# Patient Record
Sex: Female | Born: 1945 | Race: Black or African American | Hispanic: No | Marital: Married | Smoking: Never smoker
Health system: Southern US, Community
[De-identification: ages and names within clinical notes are randomized; demographics above are authoritative.]

## PROBLEM LIST (undated history)

## (undated) DIAGNOSIS — E039 Hypothyroidism, unspecified: Secondary | ICD-10-CM

## (undated) DIAGNOSIS — R519 Headache, unspecified: Secondary | ICD-10-CM

## (undated) DIAGNOSIS — I10 Essential (primary) hypertension: Secondary | ICD-10-CM

## (undated) DIAGNOSIS — E079 Disorder of thyroid, unspecified: Secondary | ICD-10-CM

## (undated) DIAGNOSIS — M1990 Unspecified osteoarthritis, unspecified site: Secondary | ICD-10-CM

## (undated) HISTORY — PX: CATARACT EXTRACTION: SUR2

## (undated) HISTORY — PX: TUBAL LIGATION: SHX77

## (undated) HISTORY — PX: KNEE ARTHROPLASTY: SHX992

---

## 2016-06-02 DIAGNOSIS — Z87898 Personal history of other specified conditions: Secondary | ICD-10-CM | POA: Diagnosis not present

## 2016-06-02 DIAGNOSIS — R0602 Shortness of breath: Secondary | ICD-10-CM | POA: Diagnosis not present

## 2016-06-02 DIAGNOSIS — K219 Gastro-esophageal reflux disease without esophagitis: Secondary | ICD-10-CM | POA: Diagnosis not present

## 2016-06-02 DIAGNOSIS — Z8679 Personal history of other diseases of the circulatory system: Secondary | ICD-10-CM | POA: Diagnosis not present

## 2016-06-02 DIAGNOSIS — E039 Hypothyroidism, unspecified: Secondary | ICD-10-CM | POA: Diagnosis not present

## 2016-06-03 DIAGNOSIS — Z23 Encounter for immunization: Secondary | ICD-10-CM | POA: Diagnosis not present

## 2016-06-03 DIAGNOSIS — E039 Hypothyroidism, unspecified: Secondary | ICD-10-CM | POA: Diagnosis not present

## 2016-06-09 DIAGNOSIS — R0602 Shortness of breath: Secondary | ICD-10-CM | POA: Diagnosis not present

## 2016-06-09 DIAGNOSIS — K765 Hepatic veno-occlusive disease: Secondary | ICD-10-CM | POA: Diagnosis not present

## 2016-06-09 DIAGNOSIS — E876 Hypokalemia: Secondary | ICD-10-CM | POA: Diagnosis not present

## 2016-06-09 DIAGNOSIS — J45909 Unspecified asthma, uncomplicated: Secondary | ICD-10-CM | POA: Diagnosis not present

## 2016-06-09 DIAGNOSIS — Z8679 Personal history of other diseases of the circulatory system: Secondary | ICD-10-CM | POA: Diagnosis not present

## 2017-01-15 ENCOUNTER — Ambulatory Visit (HOSPITAL_COMMUNITY)
Admission: EM | Admit: 2017-01-15 | Discharge: 2017-01-15 | Disposition: A | Discharge status: Home / Self Care | Payer: Medicare Other | Attending: Nurse Practitioner | Admitting: Nurse Practitioner

## 2017-01-15 ENCOUNTER — Encounter (HOSPITAL_COMMUNITY): Payer: Self-pay | Admitting: Family Medicine

## 2017-01-15 DIAGNOSIS — L247 Irritant contact dermatitis due to plants, except food: Secondary | ICD-10-CM | POA: Diagnosis not present

## 2017-01-15 DIAGNOSIS — B372 Candidiasis of skin and nail: Secondary | ICD-10-CM | POA: Diagnosis not present

## 2017-01-15 HISTORY — DX: Disorder of thyroid, unspecified: E07.9

## 2017-01-15 HISTORY — DX: Essential (primary) hypertension: I10

## 2017-01-15 MED ORDER — CLOTRIMAZOLE 1 % EX CREA: TOPICAL_CREAM | CUTANEOUS | 0 refills | Status: DC

## 2017-01-15 MED ORDER — PREDNISONE 20 MG PO TABS: 20.0000 mg | ORAL_TABLET | Freq: Every day | ORAL | 0 refills | Status: AC

## 2017-01-15 MED ORDER — PREDNISONE 20 MG PO TABS: 20.0000 mg | ORAL_TABLET | Freq: Every day | ORAL | 0 refills | Status: DC

## 2017-01-15 NOTE — ED Triage Notes (Signed)
Pt here for rash to right FA and spreading to chest and face. sts she has been in her sons back yard. Possibly poison ivy. Area blistered and oozing.

## 2017-01-15 NOTE — ED Provider Notes (Signed)
CSN: [id number]     Arrival date & time 01/15/17  1535 History   First MD Initiated Contact with Patient 01/15/17 1625     Chief Complaint  Patient presents with  . Rash   (Consider location/radiation/quality/duration/timing/severity/associated sxs/prior Treatment) Patient is a well-appearing 71 y.o. Female, here for rash. She reports rash on her fight upper forearm for almost one week that is describe as very itchy and is spreading and draining. She was at her son's backyard prior to rash onset and believes that she may got in contact with poison ivy. While she is here, she also want her rash underneath her right breast to be evaluated as well. Patient endorses the rash under right breast for 1 week as well, is a recurrent problem, she believes it is from moisture. In the past, she would put corn starch on the rash and rash would resolved.        Past Medical History:  Diagnosis Date  . Hypertension   . Thyroid disease    History reviewed. No pertinent surgical history. History reviewed. No pertinent family history. Social History  Substance Use Topics  . Smoking status: Never Smoker  . Smokeless tobacco: Never Used  . Alcohol use Not on file   OB History    No data available     Review of Systems  Constitutional: Negative for fatigue and fever.  Respiratory: Negative.   Cardiovascular: Negative.   Skin: Positive for rash.    Allergies  Patient has no known allergies.  Home Medications   Prior to Admission medications   Medication Sig Start Date End Date Taking? Authorizing Provider  clotrimazole (LOTRIMIN) 1 % cream Apply to affected area 2 times daily for 2-4 weeks. 01/15/17   Lucia Estelle, NP  predniSONE (DELTASONE) 20 MG tablet Take 1 tablet (20 mg total) by mouth daily with breakfast. Take 60 mg on day 1-3, take 40 mg on day 4-6, take 20 mg on 7-9 01/15/17 01/24/17  Lucia Estelle, NP   Meds Ordered and Administered this Visit  Medications - No data to display  BP (!)  141/73   Pulse 83   Temp 97.5 F (36.4 C)   Resp 18   SpO2 96%  No data found.   Physical Exam  Constitutional: She is oriented to person, place, and time. She appears well-developed and well-nourished.  Cardiovascular: Normal rate.   Pulmonary/Chest: Effort normal.  Neurological: She is alert and oriented to person, place, and time.  Skin:  See pictures below.   Forearm: Has bulla and vesicles, has serous drainage.  Breast: Rash under right breast is moist with diffuse erythema with no papule or vesicle. Is non-tender to palpate. Wood's lamp examination unremarkable.    Nursing note and vitals reviewed.       Urgent Care Course     Procedures (including critical care time)  Labs Review Labs Reviewed - No data to display  Imaging Review No results found.  MDM   1. Irritant contact dermatitis due to plants, except food   2. Candidiasis, intertrigo    1) Start prednisone 9-day taper, take benadryl for itchiness. Keep wound cover. Change dressing daily 2) Apply clotrimazole cream BID x 2-4 weeks.  3) Please follow up with PCP for no improvement.     Lucia Estelle, NP 01/15/17 1711

## 2017-01-15 NOTE — Discharge Instructions (Signed)
Your prescriptions has been send to your pharmacy. Please follow up with your primary care doctor if you notice no improvement.

## 2017-01-29 ENCOUNTER — Emergency Department (HOSPITAL_COMMUNITY)
Admission: EM | Admit: 2017-01-29 | Discharge: 2017-01-29 | Disposition: A | Discharge status: Home / Self Care | Payer: Medicare Other | Attending: Emergency Medicine | Admitting: Emergency Medicine

## 2017-01-29 ENCOUNTER — Emergency Department (HOSPITAL_COMMUNITY): Payer: Medicare Other

## 2017-01-29 ENCOUNTER — Encounter (HOSPITAL_COMMUNITY): Payer: Self-pay | Admitting: *Deleted

## 2017-01-29 DIAGNOSIS — Z79899 Other long term (current) drug therapy: Secondary | ICD-10-CM | POA: Diagnosis not present

## 2017-01-29 DIAGNOSIS — I10 Essential (primary) hypertension: Secondary | ICD-10-CM | POA: Diagnosis not present

## 2017-01-29 DIAGNOSIS — R1011 Right upper quadrant pain: Secondary | ICD-10-CM | POA: Diagnosis not present

## 2017-01-29 DIAGNOSIS — R1013 Epigastric pain: Secondary | ICD-10-CM | POA: Diagnosis not present

## 2017-01-29 DIAGNOSIS — R109 Unspecified abdominal pain: Secondary | ICD-10-CM

## 2017-01-29 LAB — HEPATIC FUNCTION PANEL
ALBUMIN: 3.7 g/dL (ref 3.5–5.0)
ALT: 26 U/L (ref 14–54)
AST: 18 U/L (ref 15–41)
Alkaline Phosphatase: 72 U/L (ref 38–126)
BILIRUBIN TOTAL: 0.7 mg/dL (ref 0.3–1.2)
Bilirubin, Direct: 0.1 mg/dL (ref 0.1–0.5)
Indirect Bilirubin: 0.6 mg/dL (ref 0.3–0.9)
TOTAL PROTEIN: 7 g/dL (ref 6.5–8.1)

## 2017-01-29 LAB — CBC
HEMATOCRIT: 39.9 % (ref 36.0–46.0)
Hemoglobin: 12.5 g/dL (ref 12.0–15.0)
MCH: 29.3 pg (ref 26.0–34.0)
MCHC: 31.3 g/dL (ref 30.0–36.0)
MCV: 93.7 fL (ref 78.0–100.0)
PLATELETS: 294 10*3/uL (ref 150–400)
RBC: 4.26 MIL/uL (ref 3.87–5.11)
RDW: 13.9 % (ref 11.5–15.5)
WBC: 6 10*3/uL (ref 4.0–10.5)

## 2017-01-29 LAB — BASIC METABOLIC PANEL
Anion gap: 6 (ref 5–15)
BUN: 12 mg/dL (ref 6–20)
CHLORIDE: 103 mmol/L (ref 101–111)
CO2: 30 mmol/L (ref 22–32)
Calcium: 9.3 mg/dL (ref 8.9–10.3)
Creatinine, Ser: 1.13 mg/dL — ABNORMAL HIGH (ref 0.44–1.00)
GFR calc Af Amer: 55 mL/min — ABNORMAL LOW (ref >60)
GFR calc non Af Amer: 48 mL/min — ABNORMAL LOW (ref >60)
Glucose, Bld: 127 mg/dL — ABNORMAL HIGH (ref 65–99)
POTASSIUM: 3.2 mmol/L — AB (ref 3.5–5.1)
Sodium: 139 mmol/L (ref 135–145)

## 2017-01-29 LAB — I-STAT TROPONIN, ED
TROPONIN I, POC: 0 ng/mL (ref 0.00–0.08)
Troponin i, poc: 0 ng/mL (ref 0.00–0.08)

## 2017-01-29 LAB — LIPASE, BLOOD: Lipase: 23 U/L (ref 11–51)

## 2017-01-29 MED ORDER — HYDROCODONE-ACETAMINOPHEN 5-325 MG PO TABS: 1.0000 | ORAL_TABLET | Freq: Four times a day (QID) | ORAL | 0 refills | Status: DC | PRN

## 2017-01-29 MED ORDER — GI COCKTAIL ~~LOC~~
30.0000 mL | Freq: Once | ORAL | Status: AC
  Administered 2017-01-29: 30 mL via ORAL
  Filled 2017-01-29: qty 30

## 2017-01-29 MED ORDER — OMEPRAZOLE 20 MG PO CPDR: 20.0000 mg | DELAYED_RELEASE_CAPSULE | Freq: Two times a day (BID) | ORAL | 0 refills | Status: AC

## 2017-01-29 NOTE — ED Notes (Signed)
Patient in room and on monitor after ultrasound.

## 2017-01-29 NOTE — Discharge Instructions (Signed)
Prilosec as prescribed.  Hydrocodone as prescribed as needed for pain.  Return to the emergency department if your symptoms significantly worsen or change.

## 2017-01-29 NOTE — ED Notes (Signed)
Pt returns from xray

## 2017-01-29 NOTE — ED Triage Notes (Signed)
PT reports Epigastric pressure since Thursday . Pt also reports burping  . Pt burping during triage . Pt denies any N/V.

## 2017-01-29 NOTE — ED Provider Notes (Signed)
[facility]ENCY [facility]PT Provider Note   CSN: [id number] Arrival date & time: 01/29/17  4098     History   Chief Complaint Chief Complaint  Patient presents with  . Chest Pain    HPI Joann Nguyen is a 71 y.o. female.  Patient is a 71 year old female with past medical history of hypertension, hypothyroidism. She presents today for evaluation of epigastric, lower chest discomfort that has been ongoing for the past 3 days. She describes this as a cramping and if "something is moving around in there". She denies any fevers or chills. She denies any shortness of breath or diaphoresis, but does feel somewhat nauseated and is if she has to belch. She denies any exertional component. She denies any relation to food.  Patient recently relocated here from [place] and has no primary doctor locally and no prior medical records are available for review.   The history is provided by the patient.  Chest Pain   This is a new problem. Episode onset: 3 days ago. The problem has been gradually worsening. The pain is present in the epigastric region. The pain is moderate. Quality: Cramping. The pain does not radiate. Associated symptoms include abdominal pain and nausea. Pertinent negatives include no diaphoresis, no fever and no shortness of breath. She has tried nothing for the symptoms.    Past Medical History:  Diagnosis Date  . Hypertension   . Thyroid disease     There are no active problems to display for this patient.   History reviewed. No pertinent surgical history.  OB History    No data available       Home Medications    Prior to Admission medications   Medication Sig Start Date End Date Taking? Authorizing Provider  hydrochlorothiazide (HYDRODIURIL) 25 MG tablet Take 25 mg by mouth daily.   Yes [provider]  levothyroxine (SYNTHROID, LEVOTHROID) 75 MCG tablet Take 75 mcg by mouth daily before breakfast.   Yes [provider]  lovastatin (MEVACOR) 20 MG  tablet Take 20 mg by mouth at bedtime.   Yes [provider]  ranitidine (ZANTAC) 150 MG tablet Take 150 mg by mouth daily.   Yes [provider]  clotrimazole (LOTRIMIN) 1 % cream Apply to affected area 2 times daily for 2-4 weeks. Patient not taking: Reported on 01/29/2017 01/15/17   Lucia Estelle, NP    Family History History reviewed. No pertinent family history.  Social History Social History  Substance Use Topics  . Smoking status: Never Smoker  . Smokeless tobacco: Never Used  . Alcohol use 0.6 oz/week    1 Glasses of wine per week     Comment: social     Allergies   Patient has no known allergies.   Review of Systems Review of Systems  Constitutional: Negative for diaphoresis and fever.  Respiratory: Negative for shortness of breath.   Cardiovascular: Positive for chest pain.  Gastrointestinal: Positive for abdominal pain and nausea.  All other systems reviewed and are negative.    Physical Exam Updated Vital Signs BP 135/70 (BP Location: Left Arm)   Pulse 81   Temp 98.3 F (36.8 C) (Oral)   Resp (!) 8   Ht 5' (1.524 m)   Wt 183 lb 1 oz (83 kg)   SpO2 98%   BMI 35.75 kg/m   Physical Exam  Constitutional: She is oriented to person, place, and time. She appears well-developed and well-nourished. No distress.  HENT:  Head: Normocephalic and atraumatic.  Neck: Normal  range of motion. Neck supple.  Cardiovascular: Normal rate and regular rhythm.  Exam reveals no gallop and no friction rub.   No murmur heard. Pulmonary/Chest: Effort normal and breath sounds normal. No respiratory distress. She has no wheezes.  Abdominal: Soft. Bowel sounds are normal. She exhibits no distension. There is tenderness. There is no rebound and no guarding.  There is tenderness to palpation in the right upper quadrant and epigastric region.  Musculoskeletal: Normal range of motion.  Neurological: She is alert and oriented to person, place, and time.  Skin: Skin is  warm and dry. She is not diaphoretic.  Nursing note and vitals reviewed.    ED Treatments / Results  Labs (all labs ordered are listed, but only abnormal results are displayed) Labs Reviewed  BASIC METABOLIC PANEL  CBC  LIPASE, BLOOD  I-STAT TROPOININ, ED  I-STAT TROPOININ, ED    EKG  EKG Interpretation  Date/Time:  Saturday Jan 29 2017 08:49:43 EDT Ventricular Rate:  83 PR Interval:  140 QRS Duration: 66 QT Interval:  362 QTC Calculation: 425 R Axis:   15 Text Interpretation:  Normal sinus rhythm Low voltage QRS Cannot rule out Anterior infarct , age undetermined Abnormal ECG Confirmed by Onur Mori  MD, Lauralie Blacksher ([id number]) on 01/29/2017 9:08:25 AM       Radiology No results found.  Procedures Procedures (including critical care time)  Medications Ordered in ED Medications - No data to display   Initial Impression / Assessment and Plan / ED Course  I have reviewed the triage vital signs and the nursing notes.  Pertinent labs & imaging results that were available during my care of the patient were reviewed by me and considered in my medical decision making (see chart for details).  Patient presents here with complaints of epigastric pain. She describes a sensation of "something rolling around in her upper abdomen". This is been ongoing for the past 3 days. Her symptoms do not sound cardiac in nature. Her EKG is showing nonspecific T wave abnormalities, however no evidence for injury. Her troponin 2 is negative.  She is tender to palpation in the epigastric region. For this reason, she underwent an ultrasound which was unremarkable as well. She is feeling better after a GI cocktail. Her symptoms may well be related to a gastritis or possible early ulcer. My plan is to discharge with antacids, pain medicine, and follow-up with her primary doctor.  Final Clinical Impressions(s) / ED Diagnoses   Final diagnoses:  Abdominal pain    New Prescriptions New Prescriptions   No  medications on file     Geoffery Lyonselo, Deforrest Bogle, MD 01/29/17 1426

## 2017-01-29 NOTE — ED Notes (Signed)
Patient transported to US 

## 2017-10-18 DIAGNOSIS — E039 Hypothyroidism, unspecified: Secondary | ICD-10-CM | POA: Diagnosis not present

## 2017-10-18 DIAGNOSIS — K219 Gastro-esophageal reflux disease without esophagitis: Secondary | ICD-10-CM | POA: Diagnosis not present

## 2017-10-18 DIAGNOSIS — Z1211 Encounter for screening for malignant neoplasm of colon: Secondary | ICD-10-CM | POA: Diagnosis not present

## 2017-10-18 DIAGNOSIS — R2232 Localized swelling, mass and lump, left upper limb: Secondary | ICD-10-CM | POA: Diagnosis not present

## 2017-10-18 DIAGNOSIS — Z0000 Encounter for general adult medical examination without abnormal findings: Secondary | ICD-10-CM | POA: Diagnosis not present

## 2017-10-18 DIAGNOSIS — I10 Essential (primary) hypertension: Secondary | ICD-10-CM | POA: Diagnosis not present

## 2017-10-18 DIAGNOSIS — E782 Mixed hyperlipidemia: Secondary | ICD-10-CM | POA: Diagnosis not present

## 2017-11-04 DIAGNOSIS — K5730 Diverticulosis of large intestine without perforation or abscess without bleeding: Secondary | ICD-10-CM | POA: Diagnosis not present

## 2017-11-04 DIAGNOSIS — Z1211 Encounter for screening for malignant neoplasm of colon: Secondary | ICD-10-CM | POA: Diagnosis not present

## 2018-01-09 DIAGNOSIS — H25813 Combined forms of age-related cataract, bilateral: Secondary | ICD-10-CM | POA: Diagnosis not present

## 2018-01-16 IMAGING — DX DG CHEST 2V
2 series · 2 of 2 positions shown · non-contrast
Comparison: None.

CLINICAL DATA: Chest pain.

EXAM:
CHEST  2 VIEW

[chest pa]
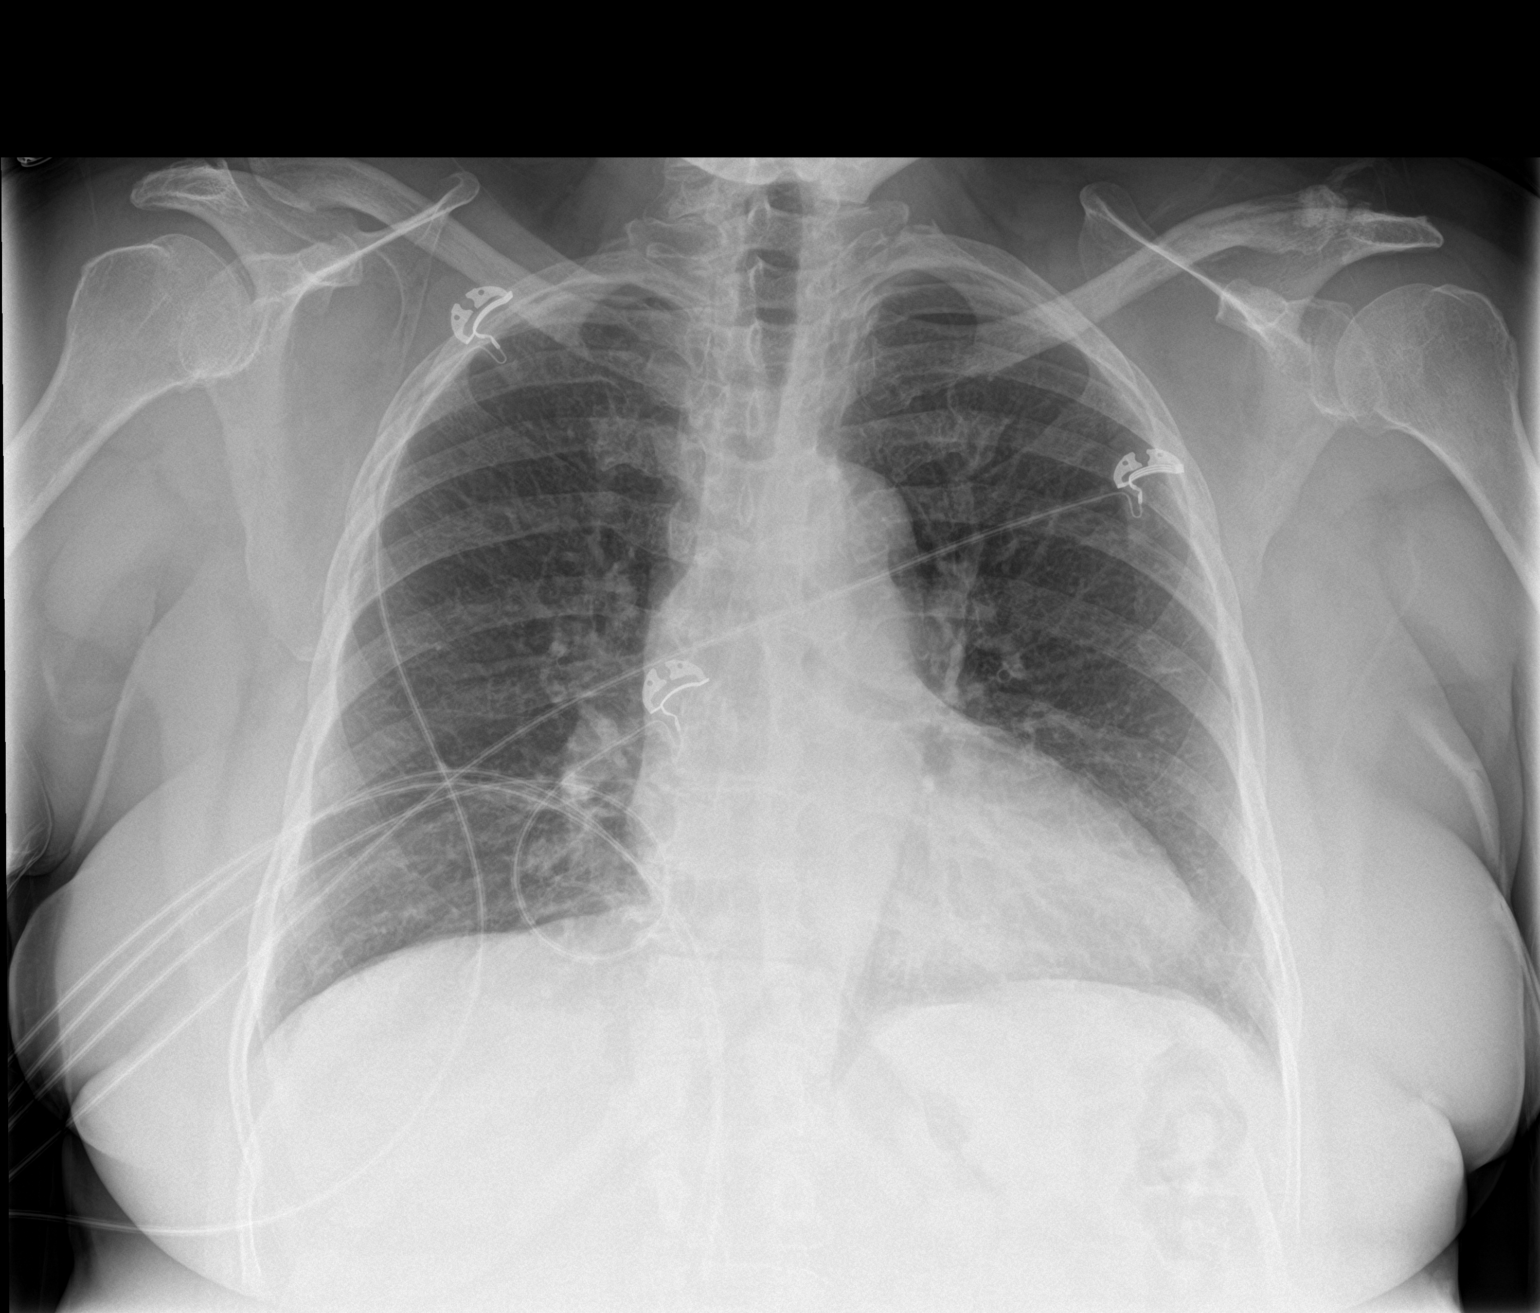

[chest lat]
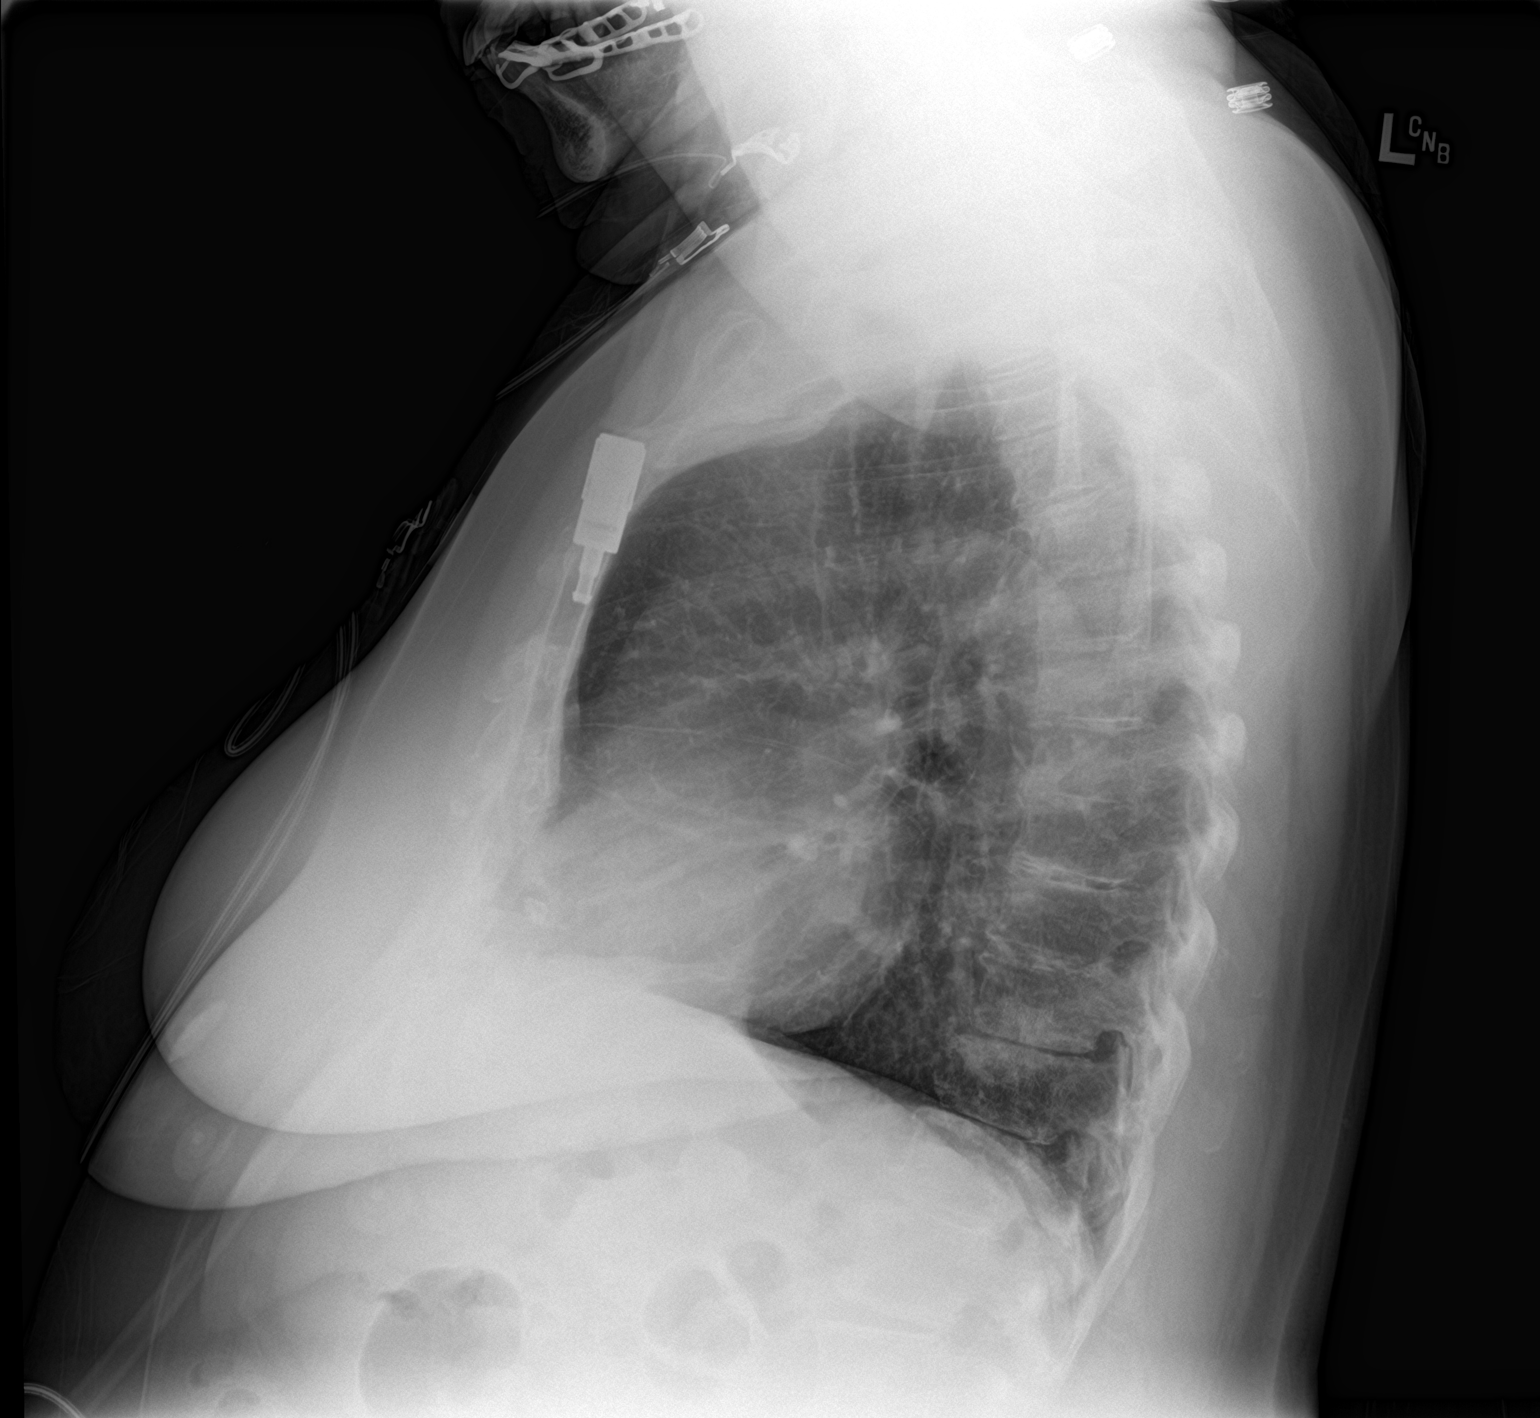

[2 of 2 positions shown; findings below may reference images not displayed]

FINDINGS: The heart size and mediastinal contours are within normal limits.
There is no evidence of pulmonary edema, consolidation,
pneumothorax, nodule or pleural fluid. The thoracic spine
demonstrates diffuse degenerative disc disease
IMPRESSION: No active cardiopulmonary disease.

## 2018-06-20 DIAGNOSIS — Z1159 Encounter for screening for other viral diseases: Secondary | ICD-10-CM | POA: Diagnosis not present

## 2018-06-20 DIAGNOSIS — Z9109 Other allergy status, other than to drugs and biological substances: Secondary | ICD-10-CM | POA: Diagnosis not present

## 2018-06-20 DIAGNOSIS — E039 Hypothyroidism, unspecified: Secondary | ICD-10-CM | POA: Diagnosis not present

## 2018-06-20 DIAGNOSIS — E782 Mixed hyperlipidemia: Secondary | ICD-10-CM | POA: Diagnosis not present

## 2018-06-20 DIAGNOSIS — I10 Essential (primary) hypertension: Secondary | ICD-10-CM | POA: Diagnosis not present

## 2018-06-20 DIAGNOSIS — Z23 Encounter for immunization: Secondary | ICD-10-CM | POA: Diagnosis not present

## 2018-06-20 DIAGNOSIS — Z780 Asymptomatic menopausal state: Secondary | ICD-10-CM | POA: Diagnosis not present

## 2018-06-21 ENCOUNTER — Other Ambulatory Visit: Payer: Self-pay | Admitting: Physician Assistant

## 2018-06-21 DIAGNOSIS — R5381 Other malaise: Secondary | ICD-10-CM

## 2018-07-11 IMAGING — US US ABDOMEN LIMITED
1 series · 14 of 25 positions shown · non-contrast
Comparison: None.

CLINICAL DATA: Abdominal pain.

EXAM:
US ABDOMEN LIMITED - RIGHT UPPER QUADRANT

[Series 1: us abdomen limited · 0.22mm/px · 14 of 32 slices shown]
[im 1/32]
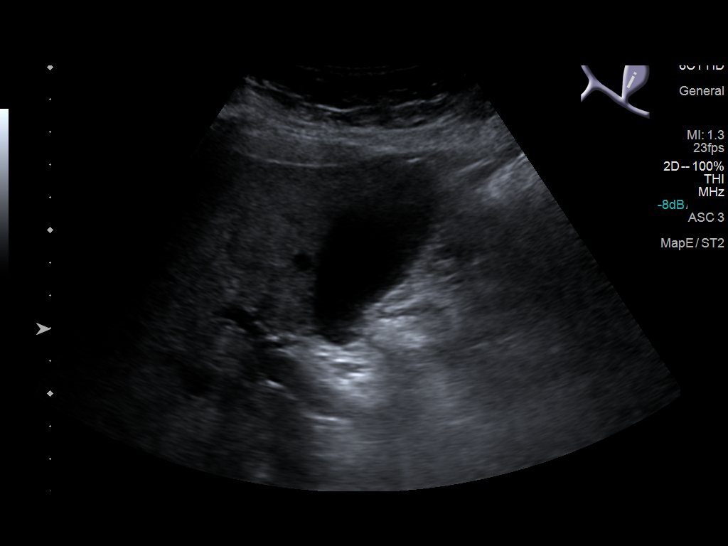
[im 3/32]
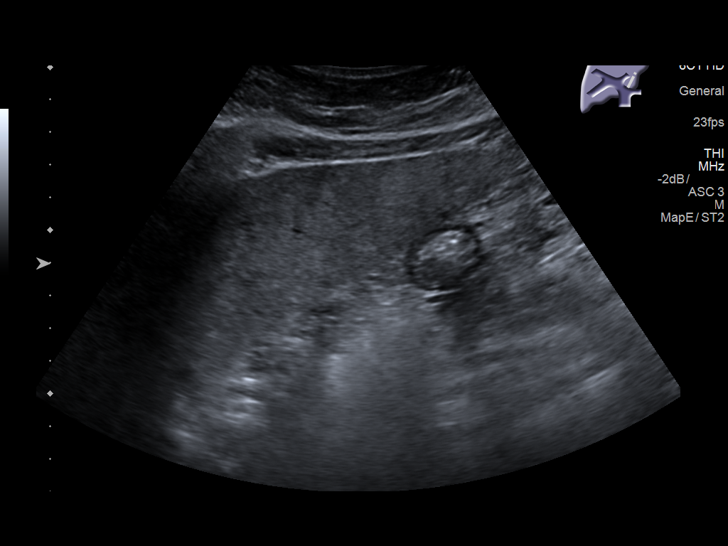
[im 6/32]
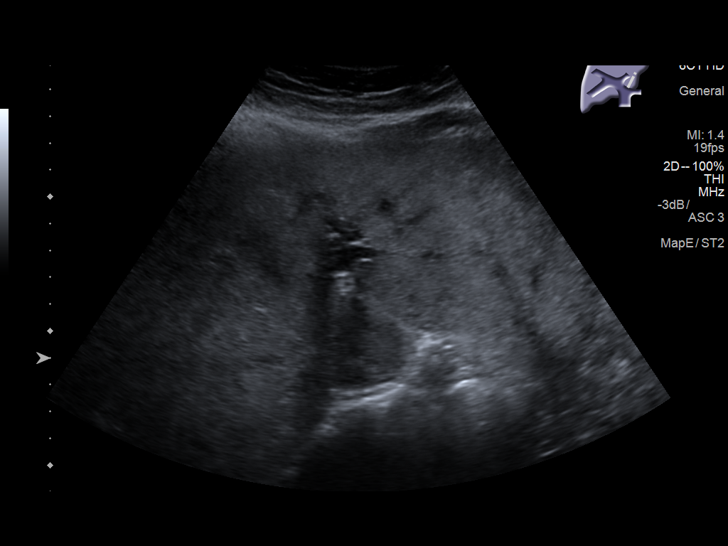
[im 8/32]
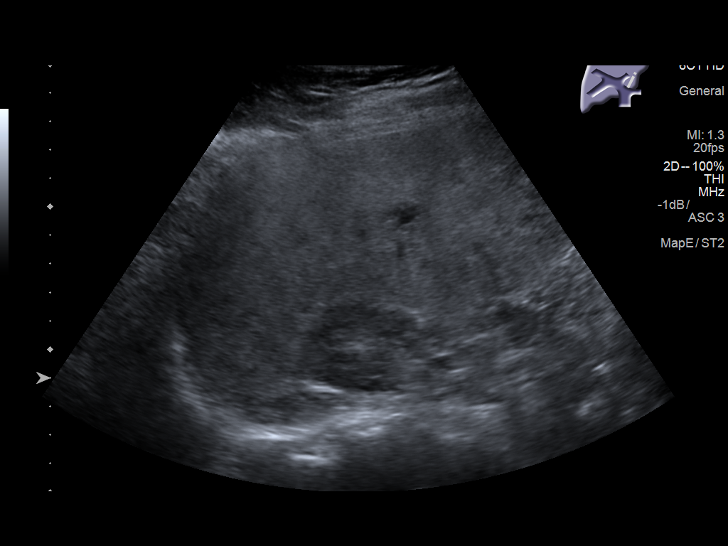
[im 11/32]
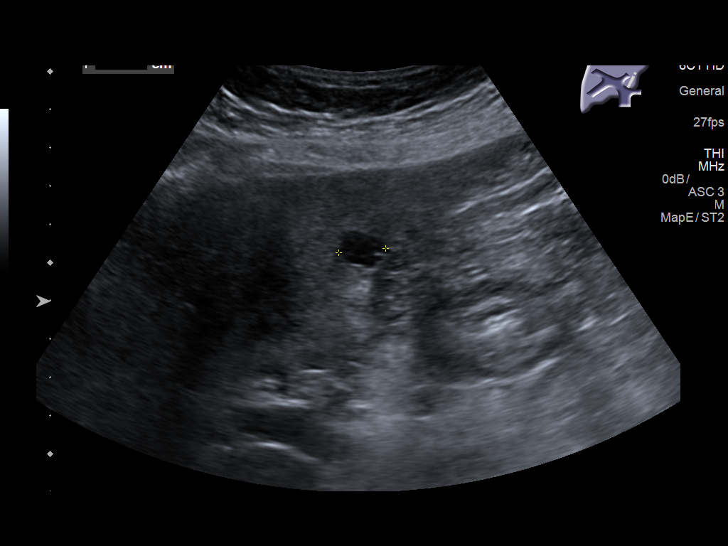
[im 12/32]
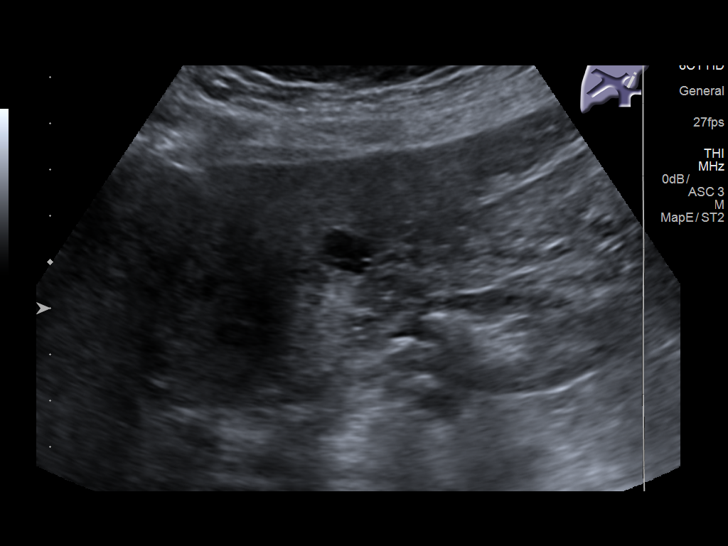
[im 15/32]
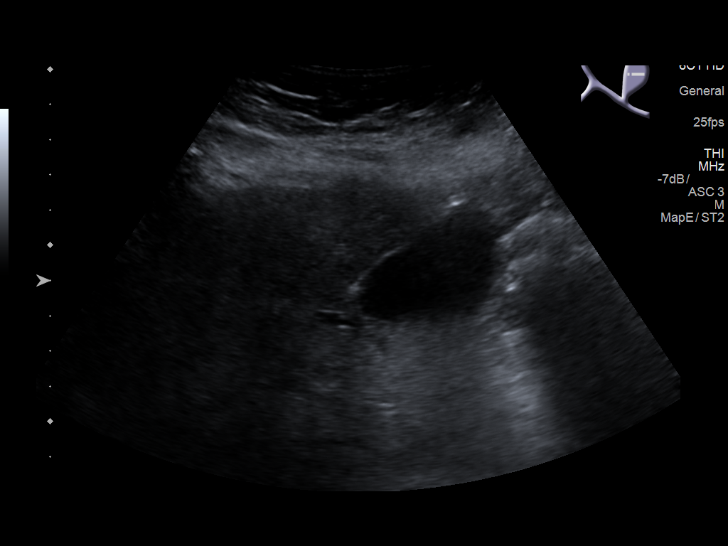
[im 17/32]
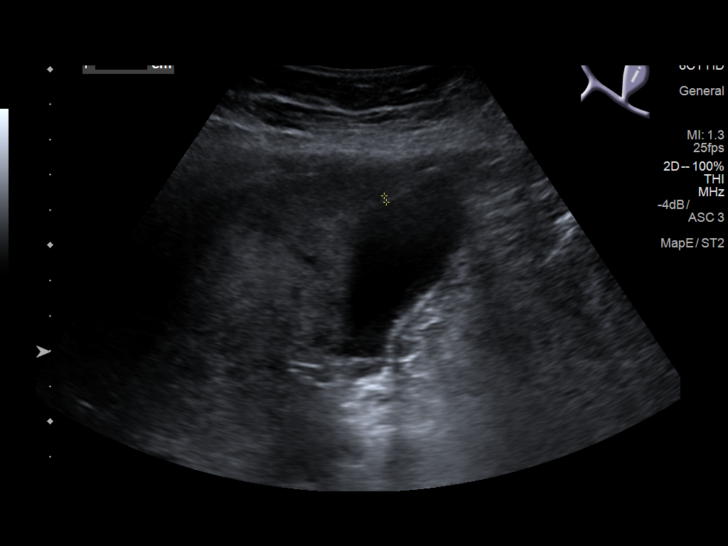
[im 20/32]
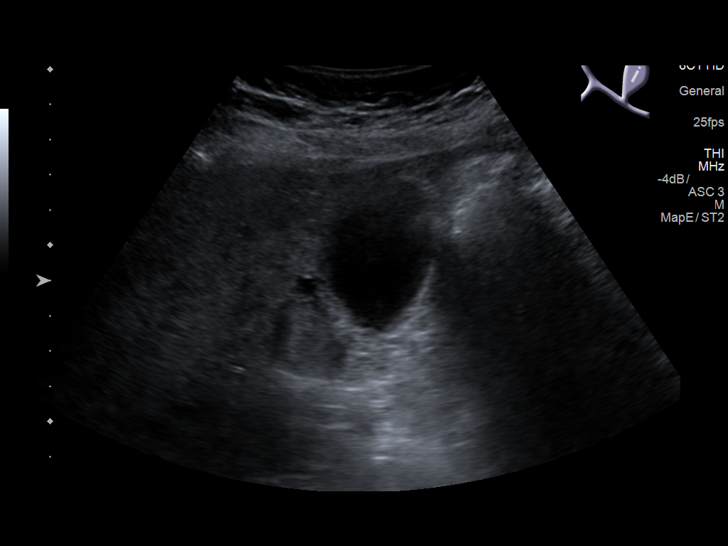
[im 21/32]
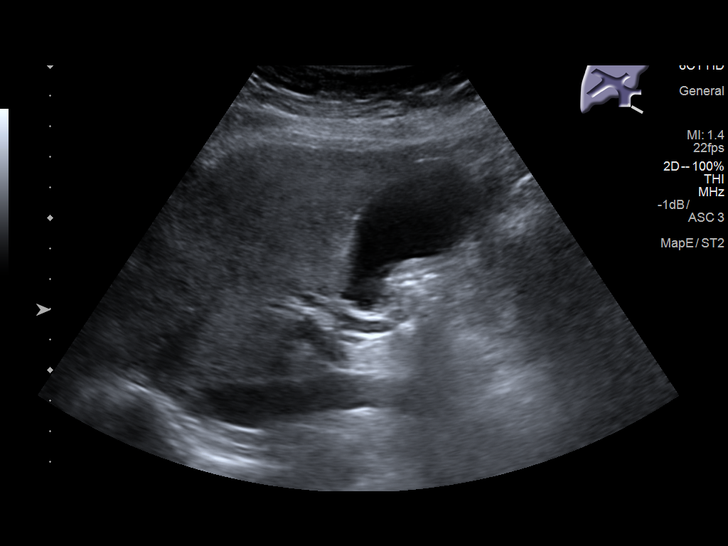
[im 24/32]
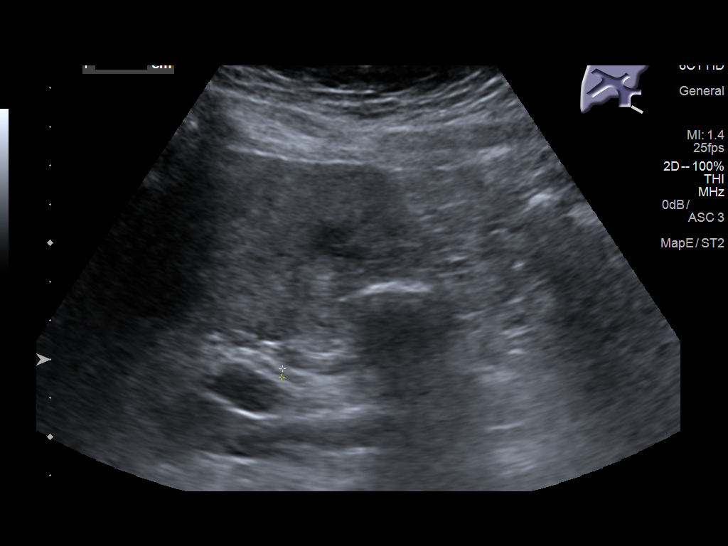
[im 26/32]
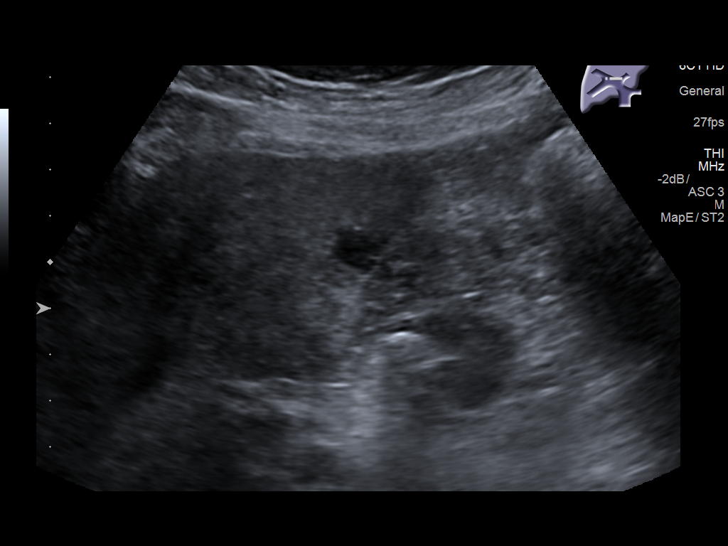
[im 29/32]
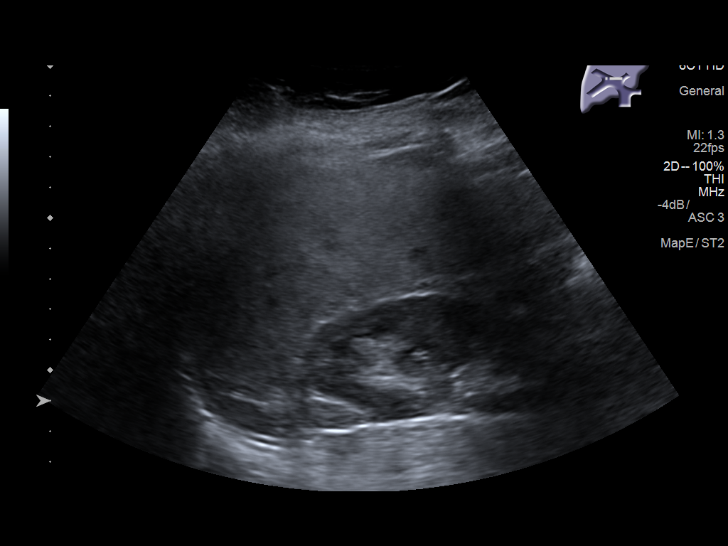
[im 32/32]
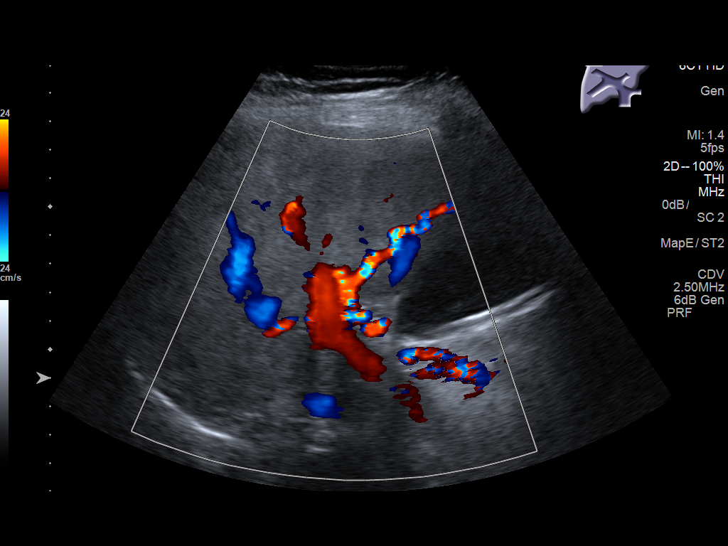

[14 of 25 positions shown; findings below may reference images not displayed]

FINDINGS: Gallbladder:

No gallstones or wall thickening visualized. No sonographic Murphy
sign noted by sonographer.

Common bile duct:

Diameter: 2.2 mm

Liver:

Mildly heterogeneous. 1.1 x 1.1 x 1.2 cm mass in the right hepatic
lobe. I suspect a mildly complicated cyst but the findings are not
specific.
IMPRESSION: 1. No acute abnormalities.
2. 1.2 cm mass in the right hepatic lobe. I favor a mildly
complicated cyst with a septation but the findings are nonspecific
on this study.

## 2019-11-16 ENCOUNTER — Ambulatory Visit: Payer: Medicare Other | Attending: Internal Medicine

## 2019-11-16 DIAGNOSIS — Z23 Encounter for immunization: Secondary | ICD-10-CM | POA: Insufficient documentation

## 2019-11-16 NOTE — Progress Notes (Signed)
   Covid-19 Vaccination Clinic  Name:  Joann Nguyen    MRN: [id number] DOB: 24-May-1946  11/16/2019  [name] was observed post Covid-19 immunization for 15 minutes without incidence. She was provided with Vaccine Information Sheet and instruction to access the V-Safe system.   [name] was instructed to call 911 with any severe reactions post vaccine: Marland Kitchen Difficulty breathing  . Swelling of your face and throat  . A fast heartbeat  . A bad rash all over your body  . Dizziness and weakness    Immunizations Administered    Name Date Dose VIS Date Route   Pfizer COVID-19 Vaccine 11/16/2019  9:52 AM 0.3 mL 08/31/2019 Intramuscular   Manufacturer: [employer], [employer]   Lot: [id number]   NDC: [address]-1

## 2019-12-11 ENCOUNTER — Ambulatory Visit: Payer: Medicare Other | Attending: Internal Medicine

## 2019-12-11 DIAGNOSIS — Z23 Encounter for immunization: Secondary | ICD-10-CM

## 2019-12-11 NOTE — Progress Notes (Signed)
   Covid-19 Vaccination Clinic  Name:  Joann Nguyen    MRN: [id number] DOB: Sep 11, 1946  12/11/2019  Joann Nguyen was observed post Covid-19 immunization for 15 minutes without incident. She was provided with Vaccine Information Sheet and instruction to access the V-Safe system.   Joann Nguyen was instructed to call 911 with any severe reactions post vaccine: Marland Kitchen Difficulty breathing  . Swelling of face and throat  . A fast heartbeat  . A bad rash all over body  . Dizziness and weakness   Immunizations Administered    Name Date Dose VIS Date Route   Pfizer COVID-19 Vaccine 12/11/2019 11:44 AM 0.3 mL 08/31/2019 Intramuscular   Manufacturer: [employer], [employer]   Lot: [id number]   NDC: [address]-2

## 2024-01-31 ENCOUNTER — Emergency Department (HOSPITAL_BASED_OUTPATIENT_CLINIC_OR_DEPARTMENT_OTHER)
Admission: EM | Admit: 2024-01-31 | Discharge: 2024-01-31 | Disposition: A | Discharge status: Home / Self Care | Attending: Emergency Medicine | Admitting: Emergency Medicine

## 2024-01-31 ENCOUNTER — Other Ambulatory Visit: Payer: Self-pay

## 2024-01-31 DIAGNOSIS — G8929 Other chronic pain: Secondary | ICD-10-CM | POA: Diagnosis not present

## 2024-01-31 DIAGNOSIS — M25562 Pain in left knee: Secondary | ICD-10-CM | POA: Insufficient documentation

## 2024-01-31 DIAGNOSIS — I10 Essential (primary) hypertension: Secondary | ICD-10-CM | POA: Insufficient documentation

## 2024-01-31 MED ORDER — DICLOFENAC SODIUM 1 % EX GEL: 4.0000 g | Freq: Four times a day (QID) | CUTANEOUS | 0 refills | Status: AC

## 2024-01-31 MED ORDER — HYDROCODONE-ACETAMINOPHEN 5-325 MG PO TABS
1.0000 | ORAL_TABLET | Freq: Once | ORAL | Status: AC
  Administered 2024-01-31: 1 via ORAL
  Filled 2024-01-31: qty 1

## 2024-01-31 MED ORDER — LIDOCAINE 5 % EX PTCH
1.0000 | MEDICATED_PATCH | CUTANEOUS | Status: DC
  Administered 2024-01-31: 1 via TRANSDERMAL
  Filled 2024-01-31: qty 1

## 2024-01-31 MED ORDER — OXYCODONE HCL 5 MG PO TABS: 2.5000 mg | ORAL_TABLET | Freq: Four times a day (QID) | ORAL | 0 refills | Status: DC | PRN

## 2024-01-31 NOTE — ED Triage Notes (Signed)
 PT POV reporting increased L leg pain due to arthritis, last cortisone shot 2 weeks ago relieved pain for 4 days and then returned.

## 2024-01-31 NOTE — ED Provider Notes (Signed)
 [facility] AT [facility] Provider Note   CSN: [id number] Arrival date & time: 01/31/24  1602     History  Chief Complaint  Patient presents with   Leg Pain    Joann Nguyen is a 78 y.o. female.  She has history of hypertension, thyroid  disease, osteoarthritis of the left knee.  She presents the ER today complaining of worsening left knee pain.  She has been seeing orthopedics for this and had a cortisone shot on 4/29.  She got relief for about 4 days and states the pain came back and has been gradually worsening, today the pain is worse and is more constant in the anterior knee, pain occasionally shoots down into the lateral leg.  No calf tenderness.  No swelling.  No redness.  Denies injury or trauma.  She took arthritis strength Tylenol  at home with minimal relief.  She states the pain has been keeping her from sleeping as she is having trouble getting in a comfortable sleep position.  She is able to ambulate with a cane.  She reports she was told she may need a knee replacement.   Leg Pain      Home Medications Prior to Admission medications   Medication Sig Start Date End Date Taking? Authorizing Provider  clotrimazole  (LOTRIMIN ) 1 % cream Apply to affected area 2 times daily for 2-4 weeks. Patient not taking: Reported on 01/29/2017 01/15/17   Zheng, Feng, NP  hydrochlorothiazide (HYDRODIURIL) 25 MG tablet Take 25 mg by mouth daily.    [provider]  HYDROcodone -acetaminophen  (NORCO) 5-325 MG tablet Take 1-2 tablets by mouth every 6 (six) hours as needed. 01/29/17   Orvilla Blander, MD  levothyroxine (SYNTHROID, LEVOTHROID) 75 MCG tablet Take 75 mcg by mouth daily before breakfast.    [provider]  lovastatin (MEVACOR) 20 MG tablet Take 20 mg by mouth at bedtime.    [provider]  omeprazole  (PRILOSEC) 20 MG capsule Take 1 capsule (20 mg total) by mouth 2 (two) times daily before a meal. 01/29/17   Orvilla Blander, MD   ranitidine (ZANTAC) 150 MG tablet Take 150 mg by mouth daily.    [provider]      Allergies    Patient has no known allergies.    Review of Systems   Review of Systems  Physical Exam Updated Vital Signs BP 136/84 (BP Location: Left Arm)   Pulse 93   Temp 98.3 F (36.8 C) (Oral)   Resp 18   Ht 5' (1.524 m)   Wt 88 kg   SpO2 95%   BMI 37.89 kg/m  Physical Exam Vitals and nursing note reviewed.  Constitutional:      General: She is not in acute distress.    Appearance: She is well-developed.  HENT:     Head: Normocephalic and atraumatic.     Mouth/Throat:     Mouth: Mucous membranes are moist.  Eyes:     Extraocular Movements: Extraocular movements intact.     Conjunctiva/sclera: Conjunctivae normal.     Pupils: Pupils are equal, round, and reactive to light.  Cardiovascular:     Rate and Rhythm: Normal rate and regular rhythm.     Heart sounds: No murmur heard. Pulmonary:     Effort: Pulmonary effort is normal. No respiratory distress.     Breath sounds: Normal breath sounds.  Abdominal:     Palpations: Abdomen is soft.     Tenderness: There is no abdominal tenderness.  Musculoskeletal:        General: No swelling.     Cervical back: Neck supple.     Comments: Mild diffuse tenderness left anterior knee, patient can flex and extend without difficulty but states it is painful.  There is no redness, no warmth, no obvious effusion.  Distal pulses are intact.  Skin:    General: Skin is warm and dry.     Capillary Refill: Capillary refill takes less than 2 seconds.  Neurological:     General: No focal deficit present.     Mental Status: She is alert and oriented to person, place, and time.  Psychiatric:        Mood and Affect: Mood normal.     ED Results / Procedures / Treatments   Labs (all labs ordered are listed, but only abnormal results are displayed) Labs Reviewed - No data to display  EKG None  Radiology No results  found.  Procedures Procedures    Medications Ordered in ED Medications  HYDROcodone -acetaminophen  (NORCO/VICODIN) 5-325 MG per tablet 1 tablet (has no administration in time range)  lidocaine (LIDODERM) 5 % 1 patch (has no administration in time range)    ED Course/ Medical Decision Making/ A&P                                 Medical Decision Making Differential diagnosis includes but limited to osteoarthritis, gout, pseudogout, inflammatory arthritis, septic arthritis, fracture, sprain, other  ED course: Patient presents the ER for chronic left knee pain that is gradually worsening.  She already follows up with orthopedics and had recent corticosteroid injection without relief.  She reports she is not supposed to take NSAIDs so has been taking Tylenol  arthritis at home without relief.  She has a reassuring exam, I am not concerned about septic arthritis.  She did not have trauma and there is no swelling or point tenderness.  She has had recent x-rays with orthopedics.  This showed significant arthritis.  Do not feel she needs further imaging today.  She has no calf pain or swelling to suggest DVT.  Will treat her pain and have her follow-up closely with orthopedics.  Will have her use topical diclofenac gel and give a small quantity of opioid analgesics.  She lives with her son, she and her son were counseled on possible side effects including but not limited to drowsiness, risk of fall, constipation.  They understand these risks.  She is going to use it likely only at nighttime.  Discussed with her we will prescribe oxycodone so she can take half a tablet and continue taking the Tylenol .  She was given strict return precautions.  Amount and/or Complexity of Data Reviewed External Data Reviewed: radiology and notes.  Risk Prescription drug management.           Final Clinical Impression(s) / ED Diagnoses Final diagnoses:  None    Rx / DC Orders ED Discharge Orders      None         Joshua Nieves 01/31/24 1956    Deatra Face, MD 02/01/24 1338

## 2024-01-31 NOTE — Discharge Instructions (Addendum)
 You were evaluated in the ER today for your knee pain.  You can continue taking the Tylenol  at home.  I have also prescribed oxycodone for more severe pain to use as needed.  You can take half a tablet.  If the half tablet does not improve your pain after 30 to 45 minutes you can take the other half.  Be careful as this can make you drowsy, or dizzy.  Do not take it with other medications that cause drowsiness or take it with alcohol, do not drive while taking it.

## 2024-04-05 ENCOUNTER — Ambulatory Visit: Payer: Self-pay | Admitting: Orthopedic Surgery

## 2024-05-17 NOTE — Patient Instructions (Signed)
 SURGICAL WAITING ROOM VISITATION  Patients having surgery or a procedure may have no more than 2 support people in the waiting area - these visitors may rotate.    Children under the age of 37 must have an adult with them who is not the patient.  Visitors with respiratory illnesses are discouraged from visiting and should remain at home.  If the patient needs to stay at the hospital during part of their recovery, the visitor guidelines for inpatient rooms apply. Pre-op nurse will coordinate an appropriate time for 1 support person to accompany patient in pre-op.  This support person may not rotate.    Please refer to the [facility] website for the visitor guidelines for Inpatients (after your surgery is over and you are in a regular room).       Your procedure is scheduled on: 05/30/24   Report to [facility] Main Entrance    Report to admitting at 9 AM   Call this number if you have problems the morning of surgery [phone]   Do not eat food :After Midnight.   After Midnight you may have the following liquids until 8:25 AM DAY OF SURGERY  Water Non-Citrus Juices (without pulp, NO RED-Apple, White grape, White cranberry) Black Coffee (NO MILK/CREAM OR CREAMERS, sugar ok)  Clear Tea (NO MILK/CREAM OR CREAMERS, sugar ok) regular and decaf                             Plain Jell-O (NO RED)                                           Fruit ices (not with fruit pulp, NO RED)                                     Popsicles (NO RED)                                                               Sports drinks like Gatorade (NO RED)                  The day of surgery:  Drink ONE (1) Pre-Surgery Clear Ensure  at 8:25 AM the morning of surgery. Drink in one sitting. Do not sip.  This drink was given to you during your hospital  pre-op appointment visit. Nothing else to drink after completing the  Pre-Surgery Clear Ensure.            Oral Hygiene is also important to reduce  your risk of infection.                                    Remember - BRUSH YOUR TEETH THE MORNING OF SURGERY WITH YOUR REGULAR TOOTHPASTE  DENTURES WILL BE REMOVED PRIOR TO SURGERY PLEASE DO NOT APPLY Poly grip OR ADHESIVES!!!   Stop all vitamins and herbal supplements 7 days before surgery.   Take these medicines the morning of surgery with  A SIP OF WATER: Levothyroxine, omeprazole , oxycodone , ranitidine.             You may not have any metal on your body including hair pins, jewelry, and body piercing             Do not wear make-up, lotions, powders, perfumes/cologne, or deodorant  Do not wear nail polish including gel and S&S, artificial/acrylic nails, or any other type of covering on natural nails including finger and toenails. If you have artificial nails, gel coating, etc. that needs to be removed by a nail salon please have this removed prior to surgery or surgery may need to be canceled/ delayed if the surgeon/ anesthesia feels like they are unable to be safely monitored.   Do not shave  48 hours prior to surgery.    Do not bring valuables to the hospital. [facility] IS NOT             RESPONSIBLE   FOR VALUABLES.   Contacts, glasses, dentures or bridgework may not be worn into surgery.   Bring small overnight bag day of surgery.   DO NOT BRING YOUR HOME MEDICATIONS TO THE HOSPITAL. PHARMACY WILL DISPENSE MEDICATIONS LISTED ON YOUR MEDICATION LIST TO YOU DURING YOUR ADMISSION IN THE HOSPITAL!    Patients discharged on the day of surgery will not be allowed to drive home.  Someone NEEDS to stay with you for the first 24 hours after anesthesia.   Special Instructions: Bring a copy of your healthcare power of attorney and living will documents the day of surgery if you haven't scanned them before.              Please read over the following fact sheets you were given: IF YOU HAVE QUESTIONS ABOUT YOUR PRE-OP INSTRUCTIONS PLEASE CALL [phone] [place]   If you received a  COVID test during your pre-op visit  it is requested that you wear a mask when out in public, stay away from anyone that may not be feeling well and notify your surgeon if you develop symptoms. If you test positive for Covid or have been in contact with anyone that has tested positive in the last 10 days please notify you surgeon.      Pre-operative 5 CHG Bath Instructions   You can play a key role in reducing the risk of infection after surgery. Your skin needs to be as free of germs as possible. You can reduce the number of germs on your skin by washing with CHG (chlorhexidine gluconate) soap before surgery. CHG is an antiseptic soap that kills germs and continues to kill germs even after washing.   DO NOT use if you have an allergy to chlorhexidine/CHG or antibacterial soaps. If your skin becomes reddened or irritated, stop using the CHG and notify one of our RNs at [phone].   Please shower with the CHG soap starting 4 days before surgery using the following schedule:     Please keep in mind the following:  DO NOT shave, including legs and underarms, starting the day of your first shower.   You may shave your face at any point before/day of surgery.  Place clean sheets on your bed the day you start using CHG soap. Use a clean washcloth (not used since being washed) for each shower. DO NOT sleep with pets once you start using the CHG.   CHG Shower Instructions:  If you choose to wash your hair and private area, wash first with your  normal shampoo/soap.  After you use shampoo/soap, rinse your hair and body thoroughly to remove shampoo/soap residue.  Turn the water OFF and apply about 3 tablespoons (45 ml) of CHG soap to a CLEAN washcloth.  Apply CHG soap ONLY FROM YOUR NECK DOWN TO YOUR TOES (washing for 3-5 minutes)  DO NOT use CHG soap on face, private areas, open wounds, or sores.  Pay special attention to the area where your surgery is being performed.  If you are having back  surgery, having someone wash your back for you may be helpful. Wait 2 minutes after CHG soap is applied, then you may rinse off the CHG soap.  Pat dry with a clean towel  Put on clean clothes/pajamas   If you choose to wear lotion, please use ONLY the CHG-compatible lotions on the back of this paper.     Additional instructions for the day of surgery: DO NOT APPLY any lotions, deodorants, cologne, or perfumes.   Put on clean/comfortable clothes.  Brush your teeth.  Ask your nurse before applying any prescription medications to the skin.      CHG Compatible Lotions   Aveeno Moisturizing lotion  Cetaphil Moisturizing Cream  Cetaphil Moisturizing Lotion  Clairol Herbal Essence Moisturizing Lotion, Dry Skin  Clairol Herbal Essence Moisturizing Lotion, Extra Dry Skin  Clairol Herbal Essence Moisturizing Lotion, Normal Skin  Curel Age Defying Therapeutic Moisturizing Lotion with Alpha Hydroxy  Curel Extreme Care Body Lotion  Curel Soothing Hands Moisturizing Hand Lotion  Curel Therapeutic Moisturizing Cream, Fragrance-Free  Curel Therapeutic Moisturizing Lotion, Fragrance-Free  Curel Therapeutic Moisturizing Lotion, Original Formula  Eucerin Daily Replenishing Lotion  Eucerin Dry Skin Therapy Plus Alpha Hydroxy Crme  Eucerin Dry Skin Therapy Plus Alpha Hydroxy Lotion  Eucerin Original Crme  Eucerin Original Lotion  Eucerin Plus Crme Eucerin Plus Lotion  Eucerin TriLipid Replenishing Lotion  Keri Anti-Bacterial Hand Lotion  Keri Deep Conditioning Original Lotion Dry Skin Formula Softly Scented  Keri Deep Conditioning Original Lotion, Fragrance Free Sensitive Skin Formula  Keri Lotion Fast Absorbing Fragrance Free Sensitive Skin Formula  Keri Lotion Fast Absorbing Softly Scented Dry Skin Formula  Keri Original Lotion  Keri Skin Renewal Lotion Keri Silky Smooth Lotion  Keri Silky Smooth Sensitive Skin Lotion  Nivea Body Creamy Conditioning Oil  Nivea Body Extra Enriched  Lotion  Nivea Body Original Lotion  Nivea Body Sheer Moisturizing Lotion Nivea Crme  Nivea Skin Firming Lotion  NutraDerm 30 Skin Lotion  NutraDerm Skin Lotion  NutraDerm Therapeutic Skin Cream  NutraDerm Therapeutic Skin Lotion  ProShield Protective Hand Cream  Provon moisturizing lotion Incentive Spirometer (Watch this video at home: [email or web address])  An incentive spirometer is a tool that can help keep your lungs clear and active. This tool measures how well you are filling your lungs with each breath. Taking long deep breaths may help reverse or decrease the chance of developing breathing (pulmonary) problems (especially infection) following: A long period of time when you are unable to move or be active. BEFORE THE PROCEDURE  If the spirometer includes an indicator to show your best effort, your nurse or respiratory therapist will set it to a desired goal. If possible, sit up straight or lean slightly forward. Try not to slouch. Hold the incentive spirometer in an upright position. INSTRUCTIONS FOR USE  Sit on the edge of your bed if possible, or sit up as far as you can in bed or on a chair. Hold the incentive spirometer in an upright position. Breathe out normally.  Place the mouthpiece in your mouth and seal your lips tightly around it. Breathe in slowly and as deeply as possible, raising the piston or the ball toward the top of the column. Hold your breath for 3-5 seconds or for as long as possible. Allow the piston or ball to fall to the bottom of the column. Remove the mouthpiece from your mouth and breathe out normally. Rest for a few seconds and repeat Steps 1 through 7 at least 10 times every 1-2 hours when you are awake. Take your time and take a few normal breaths between deep breaths. The spirometer may include an indicator to show your best effort. Use the indicator as a goal to work toward during each repetition. After each set of 10 deep  breaths, practice coughing to be sure your lungs are clear. If you have an incision (the cut made at the time of surgery), support your incision when coughing by placing a pillow or rolled up towels firmly against it. Once you are able to get out of bed, walk around indoors and cough well. You may stop using the incentive spirometer when instructed by your caregiver.  RISKS AND COMPLICATIONS Take your time so you do not get dizzy or light-headed. If you are in pain, you may need to take or ask for pain medication before doing incentive spirometry. It is harder to take a deep breath if you are having pain. AFTER USE Rest and breathe slowly and easily. It can be helpful to keep track of a log of your progress. Your caregiver can provide you with a simple table to help with this. If you are using the spirometer at home, follow these instructions: SEEK MEDICAL CARE IF:  You are having difficultly using the spirometer. You have trouble using the spirometer as often as instructed. Your pain medication is not giving enough relief while using the spirometer. You develop fever of 100.5 F (38.1 C) or higher. SEEK IMMEDIATE MEDICAL CARE IF:  You cough up bloody sputum that had not been present before. You develop fever of 102 F (38.9 C) or greater. You develop worsening pain at or near the incision site. MAKE SURE YOU:  Understand these instructions. Will watch your condition. Will get help right away if you are not doing well or get worse. Document Released: 01/17/2007 Document Revised: 11/29/2011 Document Reviewed: 03/20/2007 [facility] Patient Information 2014 [place], [place].

## 2024-05-17 NOTE — Progress Notes (Signed)
 COVID Vaccine received:  []  No [x]  Yes Date of any COVID positive Test in last 90 days: no PCP - [facility]. Health Cardiologist - n/a  Chest x-ray -  EKG -   Stress Test -  ECHO -  Cardiac Cath -   Bowel Prep - [x]  No  []   Yes ______  Pacemaker / ICD device [x]  No []  Yes   Spinal Cord Stimulator:[x]  No []  Yes       History of Sleep Apnea? [x]  No []  Yes   CPAP used?- [x]  No []  Yes    Does the patient monitor blood sugar?          [x]  No []  Yes  []  N/A  Patient has: [x]  NO Hx DM   []  Pre-DM                 []  DM1  []   DM2 Does patient have a [employer] or Dexacom? []  No []  Yes   Fasting Blood Sugar Ranges-  Checks Blood Sugar _____ times a day  GLP1 agonist / usual dose - no GLP1 instructions:  SGLT-2 inhibitors / usual dose - no SGLT-2 instructions:   Blood Thinner / Instructions:no Aspirin Instructions:no  Comments:   Activity level: Patient is able / to climb a flight of stairs without difficulty; [x]  No CP  [x]  No SOB,   Patient can perform ADLs without assistance.   Anesthesia review:   Patient denies shortness of breath, fever, cough and chest pain at PAT appointment.  Patient verbalized understanding and agreement to the Pre-Surgical Instructions that were given to them at this PAT appointment. Patient was also educated of the need to review these PAT instructions again prior to his/her surgery.I reviewed the appropriate phone numbers to call if they have any and questions or concerns.

## 2024-05-18 ENCOUNTER — Encounter (HOSPITAL_COMMUNITY): Payer: Self-pay

## 2024-05-18 ENCOUNTER — Encounter (HOSPITAL_COMMUNITY)
Admission: RE | Admit: 2024-05-18 | Discharge: 2024-05-18 | Disposition: A | Discharge status: Home / Self Care | Payer: PRIVATE HEALTH INSURANCE | Source: Ambulatory Visit | Attending: Specialist | Admitting: Specialist

## 2024-05-18 ENCOUNTER — Other Ambulatory Visit: Payer: Self-pay

## 2024-05-18 VITALS — BP 140/70 | HR 87 | Temp 98.2°F | Resp 18 | Ht 59.0 in | Wt 190.0 lb

## 2024-05-18 DIAGNOSIS — I10 Essential (primary) hypertension: Secondary | ICD-10-CM | POA: Insufficient documentation

## 2024-05-18 DIAGNOSIS — Z01818 Encounter for other preprocedural examination: Secondary | ICD-10-CM | POA: Insufficient documentation

## 2024-05-18 HISTORY — DX: Headache, unspecified: R51.9

## 2024-05-18 HISTORY — DX: Unspecified osteoarthritis, unspecified site: M19.90

## 2024-05-18 HISTORY — DX: Hypothyroidism, unspecified: E03.9

## 2024-05-18 LAB — CBC
HCT: 37.3 % (ref 36.0–46.0)
Hemoglobin: 11.2 g/dL — ABNORMAL LOW (ref 12.0–15.0)
MCH: 29.5 pg (ref 26.0–34.0)
MCHC: 30 g/dL (ref 30.0–36.0)
MCV: 98.2 fL (ref 80.0–100.0)
Platelets: 341 K/uL (ref 150–400)
RBC: 3.8 MIL/uL — ABNORMAL LOW (ref 3.87–5.11)
RDW: 13 % (ref 11.5–15.5)
WBC: 8.2 K/uL (ref 4.0–10.5)
nRBC: 0 % (ref 0.0–0.2)

## 2024-05-18 LAB — BASIC METABOLIC PANEL WITH GFR
Anion gap: 12 (ref 5–15)
BUN: 16 mg/dL (ref 8–23)
CO2: 25 mmol/L (ref 22–32)
Calcium: 9.8 mg/dL (ref 8.9–10.3)
Chloride: 102 mmol/L (ref 98–111)
Creatinine, Ser: 1.35 mg/dL — ABNORMAL HIGH (ref 0.44–1.00)
GFR, Estimated: 40 mL/min — ABNORMAL LOW (ref >60)
Glucose, Bld: 125 mg/dL — ABNORMAL HIGH (ref 70–99)
Potassium: 4.2 mmol/L (ref 3.5–5.1)
Sodium: 139 mmol/L (ref 135–145)

## 2024-05-18 LAB — SURGICAL PCR SCREEN
MRSA, PCR: NEGATIVE
Staphylococcus aureus: NEGATIVE

## 2024-05-22 ENCOUNTER — Ambulatory Visit: Payer: Self-pay | Admitting: Orthopedic Surgery

## 2024-05-22 NOTE — H&P (View-Only) (Signed)
 Joann Nguyen is an 78 y.o. female.   Chief Complaint: left knee pain HPI: Joann Nguyen is here today for her history and physical. She is scheduled for a left total knee arthroplasty on 05/30/2024 by Dr. Duwayne at [facility].  Dr. Duwayne and the patient mutually agreed to proceed with a total knee replacement. Risks and benefits of the procedure were discussed including stiffness, suboptimal range of motion, persistent pain, infection requiring removal of prosthesis and reinsertion, need for prophylactic antibiotics in the future, for example, dental procedures, possible need for manipulation, revision in the future and also anesthetic complications including DVT, PE, etc. We discussed the perioperative course, time in the hospital, postoperative recovery and the need for elevation to control swelling. We also discussed the predicted range of motion and the probability that squatting and kneeling would be unobtainable in the future. In addition, postoperative anticoagulation was discussed. We have obtained preoperative medical clearance as necessary. Provided illustrated handout and discussed it in detail. They will enroll in the total joint replacement educational forum at the hospital.  Prior hx of R TKA. No specific post-op issues. Used Oxy for pain and Lovenox for DVT ppx. No hx of DVT.  Past Medical History:  Diagnosis Date   Arthritis    Headache    Hypertension    Hypothyroidism    Thyroid  disease     Past Surgical History:  Procedure Laterality Date   CATARACT EXTRACTION Bilateral    KNEE ARTHROPLASTY Right    TUBAL LIGATION      No family history on file. Social History:  reports that she has never smoked. She has never used smokeless tobacco. She reports current alcohol use of about 1.0 standard drink of alcohol per week. She reports that she does not use drugs.  Allergies: No Known Allergies  Current  meds: azelastine 137 mcg (0.1 %) nasal spray cetirizine 10 mg  tablet cholecalciferol (vitamin D3) 1,250 mcg (50,000 unit) capsule gabapentin 100 mg capsule hydroCHLOROthiazide 25 mg tablet levothyroxine 50 mcg tablet losartan 100 mg tablet montelukast 10 mg tablet omeprazole  40 mg capsule,delayed release rosuvastatin 10 mg tablet traMADoL 50 mg tablet Vitamin D3  Review of Systems  Constitutional: Negative.   HENT: Negative.    Eyes: Negative.   Respiratory: Negative.    Cardiovascular: Negative.   Gastrointestinal: Negative.   Endocrine: Negative.   Genitourinary: Negative.   Musculoskeletal:  Positive for arthralgias, gait problem, joint swelling and myalgias.  Skin: Negative.   Psychiatric/Behavioral: Negative.      There were no vitals taken for this visit. Physical Exam Constitutional:      Appearance: Normal appearance.  HENT:     Head: Normocephalic and atraumatic.     Right Ear: External ear normal.     Left Ear: External ear normal.     Nose: Nose normal.     Mouth/Throat:     Pharynx: Oropharynx is clear.  Eyes:     Conjunctiva/sclera: Conjunctivae normal.  Cardiovascular:     Rate and Rhythm: Normal rate.     Pulses: Normal pulses.  Pulmonary:     Effort: Pulmonary effort is normal.     Breath sounds: Normal breath sounds.  Abdominal:     General: Bowel sounds are normal.  Musculoskeletal:     Cervical back: Normal range of motion.     Comments: Constitutional: General Appearance: healthy-appearing and NAD.  Gait and Station: Appearance: antalgic gait.  Cardiovascular System: Arterial Pulses Left: femoral normal, popliteal normal, dorsalis  pedis normal, and posterior tibialis normal. Edema Left: no edema. Varicosities Left: no varicosities.  Lymph Nodes: Inspection/Palpation Left: no inguinal LAD.  Knees: Inspection Left: no deformity and swelling. Bony Palpation Left: no tenderness of the superior pole patella, the inferior pole patella, the tibial tubercle, the medial tibial plateau, Gerdy's tubercle, or the  neck of fibula and tenderness of the medial joint lineand the lateral joint line. Soft Tissue Palpation Left: no tenderness of the quadriceps tendon, the prepatellar bursa, the patellar tendon, the medial collateral ligament, or the infrapatellar tendon. Active Range of Motion Left: limited. Stability Left: no laxity or ligamentous instability and anterior drawer sign negative and Lachman test negative. Strength Left: no hamstring weakness or quadriceps weakness and flexion 5/5 and extension 5/5.  Skin: Left Lower Extremity: normal.  Neurologic: Ankle Reflex Left: normal (2). Knee Reflex Left: normal (2). Sensation on the Left: L2 normal, L3 normal, L4 normal, L5 normal, and S1 normal.  Psychiatric: Mood and Affect: active and alert and normal mood.  MRI lower leg no mass or infection.  Skin:    General: Skin is warm and dry.  Neurological:     Mental Status: She is alert.      Assessment/Plan Impression: Left knee end-stage osteoarthritis  Plan: Pt with end-stage left knee DJD, bone-on-bone, refractory to conservative tx, scheduled for left total knee replacement by Dr. Duwayne on 05/30/24. We again discussed the procedure itself as well as risks, complications and alternatives, including but not limited to DVT, PE, infx, bleeding, failure of procedure, need for secondary procedure including manipulation, nerve injury, ongoing pain/symptoms, anesthesia risk, even stroke or death. Also discussed typical post-op protocols, activity restrictions, need for PT, flexion/extension exercises, time out of work. Discussed need for DVT ppx post-op per protocol. Discussed dental ppx and infx prevention. Also discussed limitations post-operatively such as kneeling and squatting. All questions were answered. Patient desires to proceed with surgery as scheduled.  Will hold supplements, ASA and NSAIDs accordingly. Will remain NPO after midnight the night before surgery. Will present to [facility] for pre-op testing.  Anticipate hospital stay to include at least 2 midnights given medical history and to ensure proper pain control. Plan ASA for DVT ppx post-op. Plan Oxy, Tizanidine, Colace, Miralax. Plan home with HHPT post-op with family members at home for assistance, [facility] contacted. Will follow up 10-14 days post-op for suture removal and xrays.  Plan left total knee replacement  Darice CHRISTELLA Randy, PA-C for Dr Duwayne 05/22/2024, 9:47 AM

## 2024-05-22 NOTE — H&P (Signed)
 JOSANNE BOEREMA is an 78 y.o. female.   Chief Complaint: left knee pain HPI: [name] is here today for her history and physical. She is scheduled for a left total knee arthroplasty on 05/30/2024 by Dr. Duwayne at [facility].  Dr. Duwayne and the patient mutually agreed to proceed with a total knee replacement. Risks and benefits of the procedure were discussed including stiffness, suboptimal range of motion, persistent pain, infection requiring removal of prosthesis and reinsertion, need for prophylactic antibiotics in the future, for example, dental procedures, possible need for manipulation, revision in the future and also anesthetic complications including DVT, PE, etc. We discussed the perioperative course, time in the hospital, postoperative recovery and the need for elevation to control swelling. We also discussed the predicted range of motion and the probability that squatting and kneeling would be unobtainable in the future. In addition, postoperative anticoagulation was discussed. We have obtained preoperative medical clearance as necessary. Provided illustrated handout and discussed it in detail. They will enroll in the total joint replacement educational forum at the hospital.  Prior hx of R TKA. No specific post-op issues. Used Oxy for pain and Lovenox for DVT ppx. No hx of DVT.  Past Medical History:  Diagnosis Date   Arthritis    Headache    Hypertension    Hypothyroidism    Thyroid  disease     Past Surgical History:  Procedure Laterality Date   CATARACT EXTRACTION Bilateral    KNEE ARTHROPLASTY Right    TUBAL LIGATION      No family history on file. Social History:  reports that she has never smoked. She has never used smokeless tobacco. She reports current alcohol use of about 1.0 standard drink of alcohol per week. She reports that she does not use drugs.  Allergies: No Known Allergies  Current  meds: azelastine 137 mcg (0.1 %) nasal spray cetirizine 10 mg  tablet cholecalciferol (vitamin D3) 1,250 mcg (50,000 unit) capsule gabapentin 100 mg capsule hydroCHLOROthiazide 25 mg tablet levothyroxine 50 mcg tablet losartan 100 mg tablet montelukast 10 mg tablet omeprazole  40 mg capsule,delayed release rosuvastatin 10 mg tablet traMADoL 50 mg tablet Vitamin D3  Review of Systems  Constitutional: Negative.   HENT: Negative.    Eyes: Negative.   Respiratory: Negative.    Cardiovascular: Negative.   Gastrointestinal: Negative.   Endocrine: Negative.   Genitourinary: Negative.   Musculoskeletal:  Positive for arthralgias, gait problem, joint swelling and myalgias.  Skin: Negative.   Psychiatric/Behavioral: Negative.      There were no vitals taken for this visit. Physical Exam Constitutional:      Appearance: Normal appearance.  HENT:     Head: Normocephalic and atraumatic.     Right Ear: External ear normal.     Left Ear: External ear normal.     Nose: Nose normal.     Mouth/Throat:     Pharynx: Oropharynx is clear.  Eyes:     Conjunctiva/sclera: Conjunctivae normal.  Cardiovascular:     Rate and Rhythm: Normal rate.     Pulses: Normal pulses.  Pulmonary:     Effort: Pulmonary effort is normal.     Breath sounds: Normal breath sounds.  Abdominal:     General: Bowel sounds are normal.  Musculoskeletal:     Cervical back: Normal range of motion.     Comments: Constitutional: General Appearance: healthy-appearing and NAD.  Gait and Station: Appearance: antalgic gait.  Cardiovascular System: Arterial Pulses Left: femoral normal, popliteal normal, dorsalis  pedis normal, and posterior tibialis normal. Edema Left: no edema. Varicosities Left: no varicosities.  Lymph Nodes: Inspection/Palpation Left: no inguinal LAD.  Knees: Inspection Left: no deformity and swelling. Bony Palpation Left: no tenderness of the superior pole patella, the inferior pole patella, the tibial tubercle, the medial tibial plateau, Gerdy's tubercle, or the  neck of fibula and tenderness of the medial joint lineand the lateral joint line. Soft Tissue Palpation Left: no tenderness of the quadriceps tendon, the prepatellar bursa, the patellar tendon, the medial collateral ligament, or the infrapatellar tendon. Active Range of Motion Left: limited. Stability Left: no laxity or ligamentous instability and anterior drawer sign negative and Lachman test negative. Strength Left: no hamstring weakness or quadriceps weakness and flexion 5/5 and extension 5/5.  Skin: Left Lower Extremity: normal.  Neurologic: Ankle Reflex Left: normal (2). Knee Reflex Left: normal (2). Sensation on the Left: L2 normal, L3 normal, L4 normal, L5 normal, and S1 normal.  Psychiatric: Mood and Affect: active and alert and normal mood.  MRI lower leg no mass or infection.  Skin:    General: Skin is warm and dry.  Neurological:     Mental Status: She is alert.      Assessment/Plan Impression: Left knee end-stage osteoarthritis  Plan: Pt with end-stage left knee DJD, bone-on-bone, refractory to conservative tx, scheduled for left total knee replacement by Dr. Duwayne on 05/30/24. We again discussed the procedure itself as well as risks, complications and alternatives, including but not limited to DVT, PE, infx, bleeding, failure of procedure, need for secondary procedure including manipulation, nerve injury, ongoing pain/symptoms, anesthesia risk, even stroke or death. Also discussed typical post-op protocols, activity restrictions, need for PT, flexion/extension exercises, time out of work. Discussed need for DVT ppx post-op per protocol. Discussed dental ppx and infx prevention. Also discussed limitations post-operatively such as kneeling and squatting. All questions were answered. Patient desires to proceed with surgery as scheduled.  Will hold supplements, ASA and NSAIDs accordingly. Will remain NPO after midnight the night before surgery. Will present to [facility] for pre-op testing.  Anticipate hospital stay to include at least 2 midnights given medical history and to ensure proper pain control. Plan ASA for DVT ppx post-op. Plan Oxy, Tizanidine, Colace, Miralax. Plan home with HHPT post-op with family members at home for assistance, [facility] contacted. Will follow up 10-14 days post-op for suture removal and xrays.  Plan left total knee replacement  Darice CHRISTELLA Randy, PA-C for Dr Duwayne 05/22/2024, 9:47 AM

## 2024-05-30 ENCOUNTER — Observation Stay (HOSPITAL_COMMUNITY)
Admission: RE | Admit: 2024-05-30 | Discharge: 2024-06-02 | Disposition: A | Discharge status: Home / Self Care | Payer: PRIVATE HEALTH INSURANCE | Source: Ambulatory Visit | Attending: Specialist | Admitting: Specialist

## 2024-05-30 ENCOUNTER — Other Ambulatory Visit: Payer: Self-pay

## 2024-05-30 ENCOUNTER — Encounter (HOSPITAL_COMMUNITY): Payer: Self-pay | Admitting: Specialist

## 2024-05-30 ENCOUNTER — Encounter (HOSPITAL_COMMUNITY)
Admission: RE | Disposition: A | Discharge status: Home / Self Care | Payer: Self-pay | Source: Ambulatory Visit | Attending: Specialist

## 2024-05-30 ENCOUNTER — Ambulatory Visit (HOSPITAL_BASED_OUTPATIENT_CLINIC_OR_DEPARTMENT_OTHER): Admitting: Anesthesiology

## 2024-05-30 ENCOUNTER — Ambulatory Visit (HOSPITAL_COMMUNITY)

## 2024-05-30 ENCOUNTER — Ambulatory Visit (HOSPITAL_COMMUNITY): Admitting: Anesthesiology

## 2024-05-30 DIAGNOSIS — M1712 Unilateral primary osteoarthritis, left knee: Secondary | ICD-10-CM

## 2024-05-30 DIAGNOSIS — M1990 Unspecified osteoarthritis, unspecified site: Secondary | ICD-10-CM | POA: Diagnosis present

## 2024-05-30 DIAGNOSIS — M21062 Valgus deformity, not elsewhere classified, left knee: Secondary | ICD-10-CM | POA: Insufficient documentation

## 2024-05-30 DIAGNOSIS — Z7982 Long term (current) use of aspirin: Secondary | ICD-10-CM | POA: Insufficient documentation

## 2024-05-30 DIAGNOSIS — F1090 Alcohol use, unspecified, uncomplicated: Secondary | ICD-10-CM | POA: Insufficient documentation

## 2024-05-30 DIAGNOSIS — I10 Essential (primary) hypertension: Secondary | ICD-10-CM | POA: Diagnosis not present

## 2024-05-30 DIAGNOSIS — M179 Osteoarthritis of knee, unspecified: Principal | ICD-10-CM | POA: Diagnosis present

## 2024-05-30 DIAGNOSIS — E039 Hypothyroidism, unspecified: Secondary | ICD-10-CM | POA: Insufficient documentation

## 2024-05-30 DIAGNOSIS — Z79899 Other long term (current) drug therapy: Secondary | ICD-10-CM | POA: Diagnosis not present

## 2024-05-30 HISTORY — PX: TOTAL KNEE ARTHROPLASTY: SHX125

## 2024-05-30 SURGERY — ARTHROPLASTY, KNEE, TOTAL
Anesthesia: Spinal | Site: Knee | Laterality: Left

## 2024-05-30 MED ORDER — METHOCARBAMOL 500 MG PO TABS
500.0000 mg | ORAL_TABLET | Freq: Four times a day (QID) | ORAL | Status: DC | PRN
  Administered 2024-05-30 – 2024-06-02 (×4): 500 mg via ORAL
  Filled 2024-05-30 (×4): qty 1

## 2024-05-30 MED ORDER — BUPIVACAINE-EPINEPHRINE 0.25% -1:200000 IJ SOLN
INTRAMUSCULAR | Status: DC | PRN
  Administered 2024-05-30: 20 mL

## 2024-05-30 MED ORDER — SODIUM CHLORIDE 0.9 % IR SOLN
Status: DC | PRN
  Administered 2024-05-30: 3000 mL

## 2024-05-30 MED ORDER — 0.9 % SODIUM CHLORIDE (POUR BTL) OPTIME
TOPICAL | Status: DC | PRN
  Administered 2024-05-30: 1000 mL

## 2024-05-30 MED ORDER — DEXAMETHASONE SODIUM PHOSPHATE 10 MG/ML IJ SOLN
INTRAMUSCULAR | Status: AC
  Filled 2024-05-30: qty 1

## 2024-05-30 MED ORDER — FENTANYL CITRATE PF 50 MCG/ML IJ SOSY
25.0000 ug | PREFILLED_SYRINGE | INTRAMUSCULAR | Status: DC | PRN
  Administered 2024-05-30: 50 ug via INTRAVENOUS

## 2024-05-30 MED ORDER — ALUM & MAG HYDROXIDE-SIMETH 200-200-20 MG/5ML PO SUSP: 30.0000 mL | ORAL | Status: DC | PRN

## 2024-05-30 MED ORDER — LOSARTAN POTASSIUM 50 MG PO TABS
100.0000 mg | ORAL_TABLET | Freq: Every day | ORAL | Status: DC
  Administered 2024-05-31 – 2024-06-02 (×3): 100 mg via ORAL
  Filled 2024-05-30 (×3): qty 2

## 2024-05-30 MED ORDER — BISACODYL 5 MG PO TBEC: 5.0000 mg | DELAYED_RELEASE_TABLET | Freq: Every day | ORAL | Status: DC | PRN

## 2024-05-30 MED ORDER — CHLORHEXIDINE GLUCONATE 0.12 % MT SOLN
15.0000 mL | Freq: Once | OROMUCOSAL | Status: AC
  Administered 2024-05-30: 15 mL via OROMUCOSAL

## 2024-05-30 MED ORDER — ORAL CARE MOUTH RINSE: 15.0000 mL | Freq: Once | OROMUCOSAL | Status: AC

## 2024-05-30 MED ORDER — HYDROMORPHONE HCL 1 MG/ML IJ SOLN
0.5000 mg | INTRAMUSCULAR | Status: DC | PRN
  Administered 2024-05-30 – 2024-05-31 (×2): 1 mg via INTRAVENOUS
  Filled 2024-05-30 (×2): qty 1

## 2024-05-30 MED ORDER — METHOCARBAMOL 1000 MG/10ML IJ SOLN: 500.0000 mg | Freq: Four times a day (QID) | INTRAMUSCULAR | Status: DC | PRN

## 2024-05-30 MED ORDER — DEXAMETHASONE SODIUM PHOSPHATE 10 MG/ML IJ SOLN
INTRAMUSCULAR | Status: DC | PRN
Start: 2024-05-30 — End: 2024-05-30
  Administered 2024-05-30: 10 mg via INTRAVENOUS

## 2024-05-30 MED ORDER — PHENYLEPHRINE HCL-NACL 20-0.9 MG/250ML-% IV SOLN
INTRAVENOUS | Status: DC | PRN
  Administered 2024-05-30: 20 ug/min via INTRAVENOUS

## 2024-05-30 MED ORDER — DOCUSATE SODIUM 100 MG PO CAPS
100.0000 mg | ORAL_CAPSULE | Freq: Two times a day (BID) | ORAL | Status: DC
  Administered 2024-05-30 – 2024-06-02 (×6): 100 mg via ORAL
  Filled 2024-05-30 (×6): qty 1

## 2024-05-30 MED ORDER — PROPOFOL 10 MG/ML IV BOLUS
INTRAVENOUS | Status: AC
Start: 2024-05-30 — End: 2024-05-30
  Filled 2024-05-30: qty 20

## 2024-05-30 MED ORDER — ONDANSETRON HCL 4 MG PO TABS: 4.0000 mg | ORAL_TABLET | Freq: Four times a day (QID) | ORAL | Status: DC | PRN

## 2024-05-30 MED ORDER — ONDANSETRON HCL 4 MG/2ML IJ SOLN: 4.0000 mg | Freq: Once | INTRAMUSCULAR | Status: DC | PRN

## 2024-05-30 MED ORDER — BUPIVACAINE-EPINEPHRINE (PF) 0.25% -1:200000 IJ SOLN
INTRAMUSCULAR | Status: AC
  Filled 2024-05-30: qty 30

## 2024-05-30 MED ORDER — FAMOTIDINE 20 MG PO TABS
40.0000 mg | ORAL_TABLET | Freq: Every day | ORAL | Status: DC
  Administered 2024-05-30 – 2024-06-01 (×3): 40 mg via ORAL
  Filled 2024-05-30 (×3): qty 2

## 2024-05-30 MED ORDER — ONDANSETRON HCL 4 MG/2ML IJ SOLN
INTRAMUSCULAR | Status: AC
Start: 2024-05-30 — End: 2024-05-30
  Filled 2024-05-30: qty 2

## 2024-05-30 MED ORDER — KCL IN DEXTROSE-NACL 20-5-0.9 MEQ/L-%-% IV SOLN
INTRAVENOUS | Status: AC
  Filled 2024-05-30 (×2): qty 1000

## 2024-05-30 MED ORDER — PROPOFOL 10 MG/ML IV BOLUS
INTRAVENOUS | Status: DC | PRN
  Administered 2024-05-30 (×2): 30 mg via INTRAVENOUS

## 2024-05-30 MED ORDER — PRONTOSAN WOUND IRRIGATION OPTIME
TOPICAL | Status: DC | PRN
  Administered 2024-05-30: 1 via TOPICAL

## 2024-05-30 MED ORDER — PROPOFOL 500 MG/50ML IV EMUL
INTRAVENOUS | Status: DC | PRN
  Administered 2024-05-30: 100 ug/kg/min via INTRAVENOUS

## 2024-05-30 MED ORDER — ROPIVACAINE HCL 7.5 MG/ML IJ SOLN
INTRAMUSCULAR | Status: DC | PRN
  Administered 2024-05-30: 20 mL via PERINEURAL

## 2024-05-30 MED ORDER — FENTANYL CITRATE PF 50 MCG/ML IJ SOSY
PREFILLED_SYRINGE | INTRAMUSCULAR | Status: AC
  Administered 2024-05-30: 50 ug via INTRAVENOUS
  Filled 2024-05-30: qty 2

## 2024-05-30 MED ORDER — ACETAMINOPHEN 500 MG PO TABS: 1000.0000 mg | ORAL_TABLET | Freq: Once | ORAL | Status: DC

## 2024-05-30 MED ORDER — FENTANYL CITRATE PF 50 MCG/ML IJ SOSY
PREFILLED_SYRINGE | INTRAMUSCULAR | Status: AC
  Filled 2024-05-30: qty 1

## 2024-05-30 MED ORDER — ONDANSETRON HCL 4 MG/2ML IJ SOLN
INTRAMUSCULAR | Status: DC | PRN
Start: 2024-05-30 — End: 2024-05-30
  Administered 2024-05-30: 4 mg via INTRAVENOUS

## 2024-05-30 MED ORDER — METOCLOPRAMIDE HCL 5 MG/ML IJ SOLN: 5.0000 mg | Freq: Three times a day (TID) | INTRAMUSCULAR | Status: DC | PRN

## 2024-05-30 MED ORDER — PROPOFOL 500 MG/50ML IV EMUL
INTRAVENOUS | Status: AC
Start: 2024-05-30 — End: 2024-05-30
  Filled 2024-05-30: qty 50

## 2024-05-30 MED ORDER — ACETAMINOPHEN 10 MG/ML IV SOLN
1000.0000 mg | INTRAVENOUS | Status: AC
  Administered 2024-05-30: 1000 mg via INTRAVENOUS
  Filled 2024-05-30: qty 100

## 2024-05-30 MED ORDER — METOCLOPRAMIDE HCL 5 MG PO TABS: 5.0000 mg | ORAL_TABLET | Freq: Three times a day (TID) | ORAL | Status: DC | PRN

## 2024-05-30 MED ORDER — LACTATED RINGERS IV SOLN: INTRAVENOUS | Status: DC

## 2024-05-30 MED ORDER — OXYCODONE HCL 5 MG PO TABS
5.0000 mg | ORAL_TABLET | Freq: Once | ORAL | Status: AC | PRN
  Administered 2024-05-30: 5 mg via ORAL

## 2024-05-30 MED ORDER — OXYCODONE HCL 5 MG PO TABS
10.0000 mg | ORAL_TABLET | ORAL | Status: DC | PRN
  Administered 2024-05-30 – 2024-05-31 (×3): 10 mg via ORAL
  Filled 2024-05-30 (×3): qty 2

## 2024-05-30 MED ORDER — RISAQUAD PO CAPS
1.0000 | ORAL_CAPSULE | Freq: Every day | ORAL | Status: DC
  Administered 2024-05-30 – 2024-06-02 (×4): 1 via ORAL
  Filled 2024-05-30 (×4): qty 1

## 2024-05-30 MED ORDER — ROSUVASTATIN CALCIUM 20 MG PO TABS
20.0000 mg | ORAL_TABLET | Freq: Every day | ORAL | Status: DC
  Administered 2024-05-30 – 2024-06-01 (×3): 20 mg via ORAL
  Filled 2024-05-30 (×3): qty 1

## 2024-05-30 MED ORDER — OXYCODONE HCL 5 MG/5ML PO SOLN: 5.0000 mg | Freq: Once | ORAL | Status: AC | PRN

## 2024-05-30 MED ORDER — DIPHENHYDRAMINE HCL 12.5 MG/5ML PO ELIX: 12.5000 mg | ORAL_SOLUTION | ORAL | Status: DC | PRN

## 2024-05-30 MED ORDER — MENTHOL 3 MG MT LOZG: 1.0000 | LOZENGE | OROMUCOSAL | Status: DC | PRN

## 2024-05-30 MED ORDER — BUPIVACAINE LIPOSOME 1.3 % IJ SUSP
INTRAMUSCULAR | Status: AC
  Filled 2024-05-30: qty 20

## 2024-05-30 MED ORDER — OXYCODONE HCL 5 MG PO TABS
ORAL_TABLET | ORAL | Status: AC
  Filled 2024-05-30: qty 1

## 2024-05-30 MED ORDER — MONTELUKAST SODIUM 10 MG PO TABS
10.0000 mg | ORAL_TABLET | Freq: Every day | ORAL | Status: DC
  Administered 2024-05-30 – 2024-06-01 (×3): 10 mg via ORAL
  Filled 2024-05-30 (×3): qty 1

## 2024-05-30 MED ORDER — LOSARTAN POTASSIUM-HCTZ 100-25 MG PO TABS: 1.0000 | ORAL_TABLET | Freq: Every day | ORAL | Status: DC

## 2024-05-30 MED ORDER — LORATADINE 10 MG PO TABS
10.0000 mg | ORAL_TABLET | Freq: Every day | ORAL | Status: DC
  Administered 2024-05-30 – 2024-06-01 (×3): 10 mg via ORAL
  Filled 2024-05-30 (×3): qty 1

## 2024-05-30 MED ORDER — TRAMADOL HCL 50 MG PO TABS
50.0000 mg | ORAL_TABLET | Freq: Four times a day (QID) | ORAL | Status: DC | PRN
  Administered 2024-05-30 – 2024-06-02 (×4): 50 mg via ORAL
  Filled 2024-05-30 (×4): qty 1

## 2024-05-30 MED ORDER — LEVOTHYROXINE SODIUM 50 MCG PO TABS
50.0000 ug | ORAL_TABLET | Freq: Every day | ORAL | Status: DC
  Administered 2024-05-31 – 2024-06-02 (×3): 50 ug via ORAL
  Filled 2024-05-30 (×3): qty 1

## 2024-05-30 MED ORDER — PANTOPRAZOLE SODIUM 40 MG PO TBEC
40.0000 mg | DELAYED_RELEASE_TABLET | Freq: Every day | ORAL | Status: DC
  Administered 2024-05-31 – 2024-06-02 (×3): 40 mg via ORAL
  Filled 2024-05-30 (×3): qty 1

## 2024-05-30 MED ORDER — ACETAMINOPHEN 500 MG PO TABS
1000.0000 mg | ORAL_TABLET | Freq: Four times a day (QID) | ORAL | Status: AC
Start: 2024-05-30 — End: 2024-05-31
  Administered 2024-05-30 – 2024-05-31 (×4): 1000 mg via ORAL
  Filled 2024-05-30 (×4): qty 2

## 2024-05-30 MED ORDER — CEFAZOLIN SODIUM-DEXTROSE 2-4 GM/100ML-% IV SOLN
2.0000 g | INTRAVENOUS | Status: AC
  Administered 2024-05-30: 2 g via INTRAVENOUS
  Filled 2024-05-30: qty 100

## 2024-05-30 MED ORDER — ONDANSETRON HCL 4 MG/2ML IJ SOLN: 4.0000 mg | Freq: Four times a day (QID) | INTRAMUSCULAR | Status: DC | PRN

## 2024-05-30 MED ORDER — GABAPENTIN 100 MG PO CAPS: 100.0000 mg | ORAL_CAPSULE | Freq: Every evening | ORAL | Status: DC | PRN

## 2024-05-30 MED ORDER — INFLUENZA VAC SPLIT HIGH-DOSE 0.5 ML IM SUSY
0.5000 mL | PREFILLED_SYRINGE | INTRAMUSCULAR | Status: DC
  Filled 2024-05-30: qty 0.5

## 2024-05-30 MED ORDER — MAGNESIUM CITRATE PO SOLN: 1.0000 | Freq: Once | ORAL | Status: DC | PRN

## 2024-05-30 MED ORDER — ASPIRIN 81 MG PO CHEW
81.0000 mg | CHEWABLE_TABLET | Freq: Two times a day (BID) | ORAL | Status: DC
  Administered 2024-05-31 – 2024-06-02 (×4): 81 mg via ORAL
  Filled 2024-05-30 (×4): qty 1

## 2024-05-30 MED ORDER — BUPIVACAINE IN DEXTROSE 0.75-8.25 % IT SOLN
INTRATHECAL | Status: DC | PRN
  Administered 2024-05-30: 1.4 mL via INTRATHECAL

## 2024-05-30 MED ORDER — OXYCODONE HCL 5 MG PO TABS: 5.0000 mg | ORAL_TABLET | ORAL | Status: DC | PRN

## 2024-05-30 MED ORDER — FENTANYL CITRATE PF 50 MCG/ML IJ SOSY: 50.0000 ug | PREFILLED_SYRINGE | Freq: Once | INTRAMUSCULAR | Status: AC

## 2024-05-30 MED ORDER — HYDROCHLOROTHIAZIDE 25 MG PO TABS
25.0000 mg | ORAL_TABLET | Freq: Every day | ORAL | Status: DC
  Administered 2024-05-31 – 2024-06-02 (×3): 25 mg via ORAL
  Filled 2024-05-30 (×3): qty 1

## 2024-05-30 MED ORDER — POLYETHYLENE GLYCOL 3350 17 G PO PACK
17.0000 g | PACK | Freq: Every day | ORAL | Status: DC
  Administered 2024-05-30 – 2024-06-02 (×4): 17 g via ORAL
  Filled 2024-05-30 (×4): qty 1

## 2024-05-30 MED ORDER — PHENOL 1.4 % MT LIQD: 1.0000 | OROMUCOSAL | Status: DC | PRN

## 2024-05-30 MED ORDER — PROPOFOL 1000 MG/100ML IV EMUL
INTRAVENOUS | Status: AC
  Filled 2024-05-30: qty 100

## 2024-05-30 MED ORDER — TRANEXAMIC ACID-NACL 1000-0.7 MG/100ML-% IV SOLN
1000.0000 mg | INTRAVENOUS | Status: AC
  Administered 2024-05-30: 1000 mg via INTRAVENOUS
  Filled 2024-05-30: qty 100

## 2024-05-30 MED ORDER — SODIUM CHLORIDE 0.9 % IV SOLN
INTRAVENOUS | Status: DC | PRN
  Administered 2024-05-30: 60 mL

## 2024-05-30 MED ORDER — CEFAZOLIN SODIUM-DEXTROSE 2-4 GM/100ML-% IV SOLN
2.0000 g | Freq: Four times a day (QID) | INTRAVENOUS | Status: AC
  Administered 2024-05-30 (×2): 2 g via INTRAVENOUS
  Filled 2024-05-30 (×2): qty 100

## 2024-05-30 SURGICAL SUPPLY — 56 items
ATTUNE MED DOME PAT 32 KNEE (Knees) IMPLANT
ATTUNE PS FEM LT SZ 4 CEM KNEE (Femur) IMPLANT
ATTUNE PSRP INSR SZ4 5 KNEE (Insert) IMPLANT
BAG COUNTER SPONGE SURGICOUNT (BAG) IMPLANT
BAG ZIPLOCK 12X15 (MISCELLANEOUS) IMPLANT
BASEPLATE TIBIAL ROTATING SZ 4 (Knees) IMPLANT
BLADE SAW SGTL 11.0X1.19X90.0M (BLADE) ×1 IMPLANT
BLADE SAW SGTL 13.0X1.19X90.0M (BLADE) ×1 IMPLANT
BLADE SURG SZ10 CARB STEEL (BLADE) ×2 IMPLANT
BNDG ELASTIC 4INX 5YD STR LF (GAUZE/BANDAGES/DRESSINGS) ×1 IMPLANT
BNDG ELASTIC 6INX 5YD STR LF (GAUZE/BANDAGES/DRESSINGS) ×1 IMPLANT
BOWL SMART MIX CTS (DISPOSABLE) ×1 IMPLANT
CEMENT HV SMART SET (Cement) ×2 IMPLANT
COVER SURGICAL LIGHT HANDLE (MISCELLANEOUS) ×1 IMPLANT
CUFF TRNQT CYL 34X4.125X (TOURNIQUET CUFF) ×1 IMPLANT
DRAPE INCISE IOBAN 66X45 STRL (DRAPES) IMPLANT
DRAPE SHEET LG 3/4 BI-LAMINATE (DRAPES) ×1 IMPLANT
DRAPE SURG ORHT 6 SPLT 77X108 (DRAPES) ×2 IMPLANT
DRAPE TOP 10253 STERILE (DRAPES) ×1 IMPLANT
DRAPE U-SHAPE 47X51 STRL (DRAPES) ×1 IMPLANT
DRSG AQUACEL AG ADV 3.5X10 (GAUZE/BANDAGES/DRESSINGS) ×1 IMPLANT
DURAPREP 26ML APPLICATOR (WOUND CARE) ×1 IMPLANT
ELECT BLADE TIP CTD 4 INCH (ELECTRODE) ×1 IMPLANT
ELECT PENCIL ROCKER SW 15FT (MISCELLANEOUS) ×1 IMPLANT
ELECT REM PT RETURN 15FT ADLT (MISCELLANEOUS) ×1 IMPLANT
GLOVE BIOGEL PI IND STRL 7.0 (GLOVE) ×1 IMPLANT
GLOVE BIOGEL PI IND STRL 8 (GLOVE) ×1 IMPLANT
GLOVE SURG SS PI 7.0 STRL IVOR (GLOVE) ×1 IMPLANT
GLOVE SURG SS PI 8.0 STRL IVOR (GLOVE) ×2 IMPLANT
GOWN STRL REUS W/ TWL XL LVL3 (GOWN DISPOSABLE) ×2 IMPLANT
HEMOSTAT SPONGE AVITENE ULTRA (HEMOSTASIS) IMPLANT
HOLDER FOLEY CATH W/STRAP (MISCELLANEOUS) ×1 IMPLANT
IMMOBILIZER KNEE 20 THIGH 36 (SOFTGOODS) ×1 IMPLANT
KIT TURNOVER KIT A (KITS) ×1 IMPLANT
MANIFOLD NEPTUNE II (INSTRUMENTS) ×1 IMPLANT
NS IRRIG 1000ML POUR BTL (IV SOLUTION) IMPLANT
PACK TOTAL KNEE CUSTOM (KITS) ×1 IMPLANT
PIN FIX SIGMA LCS THRD HI (PIN) IMPLANT
PROTECTOR NERVE ULNAR (MISCELLANEOUS) ×1 IMPLANT
SEALER BIPOLAR AQUA 6.0 (INSTRUMENTS) ×1 IMPLANT
SET HNDPC FAN SPRY TIP SCT (DISPOSABLE) ×1 IMPLANT
SOLUTION PRONTOSAN WOUND 350ML (IRRIGATION / IRRIGATOR) ×1 IMPLANT
SPIKE FLUID TRANSFER (MISCELLANEOUS) ×1 IMPLANT
STAPLER VISISTAT (STAPLE) IMPLANT
STRIP CLOSURE SKIN 1/2X4 (GAUZE/BANDAGES/DRESSINGS) IMPLANT
SUT BONE WAX W31G (SUTURE) IMPLANT
SUT MNCRL AB 4-0 PS2 18 (SUTURE) IMPLANT
SUT STRATAFIX PDS+ 0 24IN (SUTURE) IMPLANT
SUT VIC AB 1 CT1 27XBRD ANTBC (SUTURE) ×3 IMPLANT
SUT VIC AB 2-0 CT1 TAPERPNT 27 (SUTURE) ×3 IMPLANT
SUTURE STRATFX 0 PDS 27 VIOLET (SUTURE) ×1 IMPLANT
TRAY FOLEY MTR SLVR 16FR STAT (SET/KITS/TRAYS/PACK) ×1 IMPLANT
TUBE SUCTION HIGH CAP CLEAR NV (SUCTIONS) ×1 IMPLANT
WATER STERILE IRR 1000ML POUR (IV SOLUTION) ×2 IMPLANT
WIPE CHG 2% PREP (PERSONAL CARE ITEMS) ×1 IMPLANT
WRAP KNEE MAXI GEL POST OP (GAUZE/BANDAGES/DRESSINGS) ×1 IMPLANT

## 2024-05-30 NOTE — Anesthesia Procedure Notes (Addendum)
 Anesthesia Regional Block: Adductor canal block   Pre-Anesthetic Checklist: , timeout performed,  Correct Patient, Correct Site, Correct Laterality,  Correct Procedure, Correct Position, site marked,  Risks and benefits discussed,  Surgical consent,  Pre-op evaluation,  At surgeon's request and post-op pain management  Laterality: Left  Prep: chloraprep       Needles:  Injection technique: Single-shot  Needle Type: Echogenic Needle     Needle Length: 10cm  Needle Gauge: 21     Additional Needles:   Narrative:  Start time: 05/30/2024 10:45 AM End time: 05/30/2024 10:48 AM Injection made incrementally with aspirations every 5 mL.  Performed by: Personally  Anesthesiologist: Lucious Debby BRAVO, MD  Additional Notes: No pain on injection. No increased resistance to injection. Injection made in 5cc increments. Good needle visualization. Patient tolerated the procedure well.

## 2024-05-30 NOTE — Transfer of Care (Signed)
 Immediate Anesthesia Transfer of Care Note  Patient: Joann Nguyen  Procedure(s) Performed: ARTHROPLASTY, KNEE, TOTAL (Left: Knee)  Patient Location: PACU  Anesthesia Type:General, Regional, and Spinal  Level of Consciousness: awake and alert   Airway & Oxygen  Therapy: Patient Spontanous Breathing and Patient connected to face mask oxygen   Post-op Assessment: Report given to RN and Post -op Vital signs reviewed and stable  Post vital signs: Reviewed and stable  Last Vitals:  Vitals Value Taken Time  BP 122/71 05/30/24 13:46  Temp    Pulse 80 05/30/24 13:48  Resp 17 05/30/24 13:48  SpO2 97 % 05/30/24 13:48  Vitals shown include unfiled device data.  Last Pain:  Vitals:   05/30/24 1050  TempSrc:   PainSc: 0-No pain         Complications: No notable events documented.

## 2024-05-30 NOTE — Evaluation (Signed)
 Physical Therapy Evaluation Patient Details Name: Joann Nguyen MRN: [id number] DOB: 1946-06-02 Today's Date: 05/30/2024  History of Present Illness  78 yo female presents to therapy s/p L TKA due to failure of conservative measures. PT PMH includes but is not limited to: arthritis, HA, HTN, hypothyroidism, and R TKA.  Clinical Impression    Joann Nguyen is a 78 y.o. female POD 0 s/p L TKA. Patient reports mod I with mobility at baseline. Patient is now limited by functional impairments (see PT problem list below) and requires CGA for supine to sit and min A for sit to supine for bed mobility and CGA and cues for transfers. Patient was able to ambulate 18 feet with RW and CGA level of assist. Patient instructed in exercise to facilitate ROM and circulation to manage edema. Patient will benefit from continued skilled PT interventions to address impairments and progress towards PLOF. Acute PT will follow to progress mobility and stair training in preparation for safe discharge home with family support and Joann Nguyen services.       If plan is discharge home, recommend the following: A little help with walking and/or transfers;A little help with bathing/dressing/bathroom;Assistance with cooking/housework;Assist for transportation;Help with stairs or ramp for entrance   Can travel by private vehicle        Equipment Recommendations Rolling walker (2 wheels) (You[employer])  Recommendations for Other Services       Functional Status Assessment Patient has had a recent decline in their functional status and demonstrates the ability to make significant improvements in function in a reasonable and predictable amount of time.     Precautions / Restrictions Precautions Precautions: Knee;Fall Restrictions Weight Bearing Restrictions Per Provider Order: No      Mobility  Bed Mobility Overal bed mobility: Needs Assistance Bed Mobility: Supine to Sit, Sit to Supine     Supine to sit: Contact guard, HOB  elevated, Used rails Sit to supine: Min assist   General bed mobility comments: cues and increased time with encouragement for supine to sit, return to flat bed with min A for L LE    Transfers Overall transfer level: Needs assistance Equipment used: Rolling walker (2 wheels) Transfers: Sit to/from Stand Sit to Stand: Contact guard assist           General transfer comment: min cues pull to stand    Ambulation/Gait Ambulation/Gait assistance: Contact guard assist Gait Distance (Feet): 18 Feet Assistive device: Rolling walker (2 wheels) Gait Pattern/deviations: Step-to pattern, Decreased stance time - left, Antalgic, Trunk flexed, Wide base of support Gait velocity: decreased     General Gait Details: lateral sway, B UE support at RW to offload L LE in stance phase with min cues for safety, posture and RW management  Stairs            Wheelchair Mobility     Tilt Bed    Modified Rankin (Stroke Patients Only)       Balance Overall balance assessment: Needs assistance Sitting-balance support: Feet supported Sitting balance-Leahy Scale: Good     Standing balance support: Bilateral upper extremity supported, During functional activity, Reliant on assistive device for balance Standing balance-Leahy Scale: Poor                               Pertinent Vitals/Pain Pain Assessment Pain Assessment: 0-10 Pain Score: 8  Pain Location: L knee and LE Pain Descriptors / Indicators: Aching, Constant, Discomfort, Dull,  Grimacing, Operative site guarding Pain Intervention(s): Limited activity within patient's tolerance, Monitored during session, Premedicated before session, Repositioned, Ice applied    Home Living Family/patient expects to be discharged to:: Private residence Living Arrangements: Children Available Help at Discharge: Family Type of Home: House Home Access: Stairs to enter Entrance Stairs-Rails: None Entrance Stairs-Number of Steps: 2  (non sequential)   Home Layout: One level Home Equipment: Cane - single point      Prior Function Prior Level of Function : Independent/Modified Independent             Mobility Comments: mod I with SPC for all ADLs, self care tasks and IADLs       Extremity/Trunk Assessment        Lower Extremity Assessment Lower Extremity Assessment: LLE deficits/detail LLE Deficits / Details: ankle DF/PF 5/5; SLR < 10 degree lag LLE Sensation: WNL (foot is cold)    Cervical / Trunk Assessment Cervical / Trunk Assessment: Normal  Communication   Communication Communication: Impaired Factors Affecting Communication: Hearing impaired (no hearing aids at hospital)    Cognition Arousal: Alert Behavior During Therapy: WFL for tasks assessed/performed   PT - Cognitive impairments: No apparent impairments                         Following commands: Intact       Cueing       General Comments      Exercises Total Joint Exercises Ankle Circles/Pumps: AROM, Both, 5 reps   Assessment/Plan    PT Assessment Patient needs continued PT services  PT Problem List Decreased strength;Decreased range of motion;Decreased activity tolerance;Decreased balance;Decreased mobility;Decreased coordination;Pain       PT Treatment Interventions DME instruction;Gait training;Stair training;Functional mobility training;Therapeutic activities;Therapeutic exercise;Neuromuscular re-education;Balance training;Patient/family education;Modalities    PT Goals (Current goals can be found in the Care Plan section)  Acute Rehab PT Goals Patient Stated Goal: to be able to get back to walking and exercise PT Goal Formulation: With patient Time For Goal Achievement: 06/13/24 Potential to Achieve Goals: Good    Frequency 7X/week     Co-evaluation               AM-PAC PT 6 Clicks Mobility  Outcome Measure Help needed turning from your back to your side while in a flat bed without  using bedrails?: A Little Help needed moving from lying on your back to sitting on the side of a flat bed without using bedrails?: A Little Help needed moving to and from a bed to a chair (including a wheelchair)?: A Little Help needed standing up from a chair using your arms (e.g., wheelchair or bedside chair)?: A Little Help needed to walk in hospital room?: A Little Help needed climbing 3-5 steps with a railing? : A Lot 6 Click Score: 17    End of Session Equipment Utilized During Treatment: Gait belt Activity Tolerance: Patient limited by pain Patient left: in bed;with call bell/phone within reach;with family/visitor present Nurse Communication: Mobility status;Patient requests pain meds PT Visit Diagnosis: Unsteadiness on feet (R26.81);Other abnormalities of gait and mobility (R26.89);Muscle weakness (generalized) (M62.81);Difficulty in walking, not elsewhere classified (R26.2);Pain Pain - Right/Left: Left Pain - part of body: Knee;Leg    Time: 8171-8145 PT Time Calculation (min) (ACUTE ONLY): 26 min   Charges:   PT Evaluation $PT Eval Low Complexity: 1 Low PT Treatments $Gait Training: 8-22 mins PT General Charges $$ ACUTE PT VISIT: 1 Visit  [place], PT Acute Rehab   [place] VEAR Drone 05/30/2024, 7:06 PM

## 2024-05-30 NOTE — Brief Op Note (Signed)
 05/30/2024  10:31 AM  PATIENT:  Joann Nguyen  78 y.o. female  PRE-OPERATIVE DIAGNOSIS:  Degernative joint disease knee left  POST-OPERATIVE DIAGNOSIS:  * No post-op diagnosis entered *  PROCEDURE:  Procedure(s): ARTHROPLASTY, KNEE, TOTAL (Left) The aquamantis was utilized for this case to help facilitate better hemostasis as patient was felt to be at increased risk of bleeding because of recent anticoagulation use.   SURGEON:  Surgeons and Role:    * Duwayne Purchase, MD - Primary  PHYSICIAN ASSISTANT:   ASSISTANTS: Biss[place]   ANESTHESIA:   spinal  EBL:  50   BLOOD ADMINISTERED:none  DRAINS: none   LOCAL MEDICATIONS USED:  MARCAINE      SPECIMEN:  No Specimen  DISPOSITION OF SPECIMEN:  N/A  COUNTS:  YES  TOURNIQUET:  60 min DICTATION: .Other Dictation: Dictation Number [id number]  PLAN OF CARE: Admit for overnight observation  PATIENT DISPOSITION:  PACU - hemodynamically stable.   Delay start of Pharmacological VTE agent (>24hrs) due to surgical blood loss or risk of bleeding: no

## 2024-05-30 NOTE — Anesthesia Procedure Notes (Addendum)
 Spinal  Patient location during procedure: OR Start time: 05/30/2024 11:20 AM End time: 05/30/2024 11:23 AM Reason for block: surgical anesthesia Staffing Performed: anesthesiologist  Anesthesiologist: Lucious Debby BRAVO, MD Performed by: Lucious Debby BRAVO, MD Authorized by: Lucious Debby BRAVO, MD   Preanesthetic Checklist Completed: patient identified, IV checked, risks and benefits discussed, surgical consent, monitors and equipment checked, pre-op evaluation and timeout performed Spinal Block Patient position: sitting Prep: DuraPrep Patient monitoring: heart rate, cardiac monitor, continuous pulse ox and blood pressure Approach: midline Location: L3-4 Injection technique: single-shot Needle Needle type: Pencan  Needle gauge: 24 G Additional Notes Consent was obtained prior to the procedure with all questions answered and concerns addressed. Risks including, but not limited to, bleeding, infection, nerve damage, paralysis, failed block, inadequate analgesia, allergic reaction, high spinal, itching, and headache were discussed and the patient wished to proceed. Functioning IV was confirmed and monitors were applied. Sterile prep and drape, including hand hygiene, mask, and sterile gloves were used. The patient was positioned and the spine was prepped. The skin was anesthetized with lidocaine . Free flow of clear CSF was obtained prior to injecting local anesthetic into the CSF. The spinal needle aspirated freely following injection. The needle was carefully withdrawn. The patient tolerated the procedure well.   Debby Lucious, MD

## 2024-05-30 NOTE — Anesthesia Postprocedure Evaluation (Signed)
 Anesthesia Post Note  Patient: ARIYA BOHANNON  Procedure(s) Performed: ARTHROPLASTY, KNEE, TOTAL (Left: Knee)     Patient location during evaluation: PACU Anesthesia Type: Spinal Level of consciousness: awake and alert Pain management: pain level controlled Vital Signs Assessment: post-procedure vital signs reviewed and stable Respiratory status: spontaneous breathing and respiratory function stable Cardiovascular status: blood pressure returned to baseline and stable Postop Assessment: spinal receding and no apparent nausea or vomiting Anesthetic complications: no   No notable events documented.  Last Vitals:  Vitals:   05/30/24 1400 05/30/24 1415  BP: 135/67 135/67  Pulse: 82 80  Resp: (!) 22 17  Temp:    SpO2: 98% 96%    Last Pain:  Vitals:   05/30/24 1418  TempSrc:   PainSc: 6     LLE Motor Response: Responds to commands;Purposeful movement (05/30/24 1415) LLE Sensation: No sensation (absent);Full sensation (05/30/24 1415) RLE Motor Response: Responds to commands;Purposeful movement (05/30/24 1415) RLE Sensation: No sensation (absent);Full sensation (05/30/24 1415)      Debby FORBES Like

## 2024-05-30 NOTE — Discharge Instructions (Signed)
 Elevate leg above heart 6x a day for each Use knee immobilizer while walking until can SLR x 10 Use knee immobilizer in bed to keep knee in extension Aquacel dressing may remain in place for one week. May shower with aquacel dressing in place. If the dressing becomes saturated or peels off, you may remove aquacel dressing. Do not remove steri-strips if they are present. Place new dressing with gauze and tape or ACE bandage which should be kept clean and dry and changed daily.  INSTRUCTIONS AFTER JOINT REPLACEMENT   Remove items at home which could result in a fall. This includes throw rugs or furniture in walking pathways ICE to the affected joint every three hours while awake for 30 minutes at a time, for at least the first 3-5 days, and then as needed for pain and swelling.  Continue to use ice for pain and swelling. You may notice swelling that will progress down to the foot and ankle.  This is normal after surgery.  Elevate your leg when you are not up walking on it.   Continue to use the breathing machine you got in the hospital (incentive spirometer) which will help keep your temperature down.  It is common for your temperature to cycle up and down following surgery, especially at night when you are not up moving around and exerting yourself.  The breathing machine keeps your lungs expanded and your temperature down.   DIET:  As you were doing prior to hospitalization, we recommend a well-balanced diet.  DRESSING / WOUND CARE / SHOWERING  Keep the surgical dressing for one week.  The dressing is water  proof, so you can shower without any extra covering.  IF THE DRESSING FALLS OFF or the wound gets wet inside, change the dressing with sterile gauze.  Please use good hand washing techniques before changing the dressing.  Do not use any lotions or creams on the incision until instructed by your surgeon.    ACTIVITY  Increase activity slowly as tolerated, but follow the weight bearing  instructions below.   No driving for 6 weeks or until further direction given by your physician.  You cannot drive while taking narcotics.  No lifting or carrying greater than 10 lbs. until further directed by your surgeon. Avoid periods of inactivity such as sitting longer than an hour when not asleep. This helps prevent blood clots.  You may return to work once you are authorized by your doctor.     WEIGHT BEARING   Weight bearing as tolerated with assist device (walker, cane, etc) as directed, use it as long as suggested by your surgeon or therapist, typically at least 4-6 weeks.   EXERCISES  Results after joint replacement surgery are often greatly improved when you follow the exercise, range of motion and muscle strengthening exercises prescribed by your doctor. Safety measures are also important to protect the joint from further injury. Any time any of these exercises cause you to have increased pain or swelling, decrease what you are doing until you are comfortable again and then slowly increase them. If you have problems or questions, call your caregiver or physical therapist for advice.   Rehabilitation is important following a joint replacement. After just a few days of immobilization, the muscles of the leg can become weakened and shrink (atrophy).  These exercises are designed to build up the tone and strength of the thigh and leg muscles and to improve motion. Often times heat used for twenty to thirty minutes  before working out will loosen up your tissues and help with improving the range of motion but do not use heat for the first two weeks following surgery (sometimes heat can increase post-operative swelling).   These exercises can be done on a training (exercise) mat, on the floor, on a table or on a bed. Use whatever works the best and is most comfortable for you.    Use music or television while you are exercising so that the exercises are a pleasant break in your day. This  will make your life better with the exercises acting as a break in your routine that you can look forward to.   Perform all exercises about fifteen times, three times per day or as directed.  You should exercise both the operative leg and the other leg as well.  Exercises include:   Quad Sets - Tighten up the muscle on the front of the thigh (Quad) and hold for 5-10 seconds.   Straight Leg Raises - With your knee straight (if you were given a brace, keep it on), lift the leg to 60 degrees, hold for 3 seconds, and slowly lower the leg.  Perform this exercise against resistance later as your leg gets stronger.  Leg Slides: Lying on your back, slowly slide your foot toward your buttocks, bending your knee up off the floor (only go as far as is comfortable). Then slowly slide your foot back down until your leg is flat on the floor again.  Angel Wings: Lying on your back spread your legs to the side as far apart as you can without causing discomfort.  Hamstring Strength:  Lying on your back, push your heel against the floor with your leg straight by tightening up the muscles of your buttocks.  Repeat, but this time bend your knee to a comfortable angle, and push your heel against the floor.  You may put a pillow under the heel to make it more comfortable if necessary.   A rehabilitation program following joint replacement surgery can speed recovery and prevent re-injury in the future due to weakened muscles. Contact your doctor or a physical therapist for more information on knee rehabilitation.    CONSTIPATION  Constipation is defined medically as fewer than three stools per week and severe constipation as less than one stool per week.  Even if you have a regular bowel pattern at home, your normal regimen is likely to be disrupted due to multiple reasons following surgery.  Combination of anesthesia, postoperative narcotics, change in appetite and fluid intake all can affect your bowels.   YOU MUST use  at least one of the following options; they are listed in order of increasing strength to get the job done.  They are all available over the counter, and you may need to use some, POSSIBLY even all of these options:    Drink plenty of fluids (prune juice may be helpful) and high fiber foods Colace 100 mg by mouth twice a day  Senokot for constipation as directed and as needed Dulcolax (bisacodyl ), take with full glass of water   Miralax  (polyethylene glycol) once or twice a day as needed.  If you have tried all these things and are unable to have a bowel movement in the first 3-4 days after surgery call either your surgeon or your primary doctor.    If you experience loose stools or diarrhea, hold the medications until you stool forms back up.  If your symptoms do not get better within 1  week or if they get worse, check with your doctor.  If you experience the worst abdominal pain ever or develop nausea or vomiting, please contact the office immediately for further recommendations for treatment.   ITCHING:  If you experience itching with your medications, try taking only a single pain pill, or even half a pain pill at a time.  You can also use Benadryl  over the counter for itching or also to help with sleep.   TED HOSE STOCKINGS:  Use stockings on both legs until for at least 2 weeks or as directed by physician office. They may be removed at night for sleeping.  MEDICATIONS:  See your medication summary on the "After Visit Summary" that nursing will review with you.  You may have some home medications which will be placed on hold until you complete the course of blood thinner medication.  It is important for you to complete the blood thinner medication as prescribed.  PRECAUTIONS:  If you experience chest pain or shortness of breath - call 911 immediately for transfer to the hospital emergency department.   If you develop a fever greater that 101 F, purulent drainage from wound, increased  redness or drainage from wound, foul odor from the wound/dressing, or calf pain - CONTACT YOUR SURGEON.                                                   FOLLOW-UP APPOINTMENTS:  If you do not already have a post-op appointment, please call the office for an appointment to be seen by your surgeon.  Guidelines for how soon to be seen are listed in your "After Visit Summary", but are typically between 1-4 weeks after surgery.  OTHER INSTRUCTIONS:   Knee Replacement:  Do not place pillow under knee, focus on keeping the knee straight while resting. CPM instructions: 0-90 degrees, 2 hours in the morning, 2 hours in the afternoon, and 2 hours in the evening. Place foam block, curve side up under heel at all times except when in CPM or when walking.  DO NOT modify, tear, cut, or change the foam block in any way.  POST-OPERATIVE OPIOID TAPER INSTRUCTIONS: It is important to wean off of your opioid medication as soon as possible. If you do not need pain medication after your surgery it is ok to stop day one. Opioids include: Codeine, Hydrocodone(Norco, Vicodin), Oxycodone (Percocet, oxycontin ) and hydromorphone  amongst others.  Long term and even short term use of opiods can cause: Increased pain response Dependence Constipation Depression Respiratory depression And more.  Withdrawal symptoms can include Flu like symptoms Nausea, vomiting And more Techniques to manage these symptoms Hydrate well Eat regular healthy meals Stay active Use relaxation techniques(deep breathing, meditating, yoga) Do Not substitute Alcohol to help with tapering If you have been on opioids for less than two weeks and do not have pain than it is ok to stop all together.  Plan to wean off of opioids This plan should start within one week post op of your joint replacement. Maintain the same interval or time between taking each dose and first decrease the dose.  Cut the total daily intake of opioids by one tablet each  day Next start to increase the time between doses. The last dose that should be eliminated is the evening dose.   MAKE SURE YOU:  Understand these  instructions.  Get help right away if you are not doing well or get worse.    Thank you for letting us  be a part of your medical care team.  It is a privilege we respect greatly.  We hope these instructions will help you stay on track for a fast and full recovery!

## 2024-05-30 NOTE — Plan of Care (Signed)
  Problem: Nutrition: Goal: Adequate nutrition will be maintained Outcome: Progressing   Problem: Pain Managment: Goal: General experience of comfort will improve and/or be controlled Outcome: Progressing   Problem: Skin Integrity: Goal: Risk for impaired skin integrity will decrease Outcome: Progressing

## 2024-05-30 NOTE — Anesthesia Preprocedure Evaluation (Addendum)
 Anesthesia Evaluation  Patient identified by MRN, date of birth, ID band Patient awake    Reviewed: Allergy & Precautions, NPO status , Patient's Chart, lab work & pertinent test results  History of Anesthesia Complications Negative for: history of anesthetic complications  Airway Mallampati: II  TM Distance: >3 FB Neck ROM: Full    Dental  (+) Dental Advisory Given, Partial Upper, Partial Lower   Pulmonary neg pulmonary ROS   Pulmonary exam normal        Cardiovascular hypertension, Pt. on medications Normal cardiovascular exam     Neuro/Psych  Headaches  negative psych ROS   GI/Hepatic negative GI ROS, Neg liver ROS,,,  Endo/Other  Hypothyroidism   Obesity   Renal/GU Renal InsufficiencyRenal disease     Musculoskeletal  (+) Arthritis ,    Abdominal  (+) + obese  Peds  Hematology  (+) Blood dyscrasia, anemia   Anesthesia Other Findings   Reproductive/Obstetrics                              Anesthesia Physical Anesthesia Plan  ASA: 2  Anesthesia Plan: Spinal   Post-op Pain Management: Tylenol  PO (pre-op)* and Regional block*   Induction:   PONV Risk Score and Plan: 2 and Treatment may vary due to age or medical condition and Propofol  infusion  Airway Management Planned: Natural Airway and Simple Face Mask  Additional Equipment: None  Intra-op Plan:   Post-operative Plan:   Informed Consent: I have reviewed the patients History and Physical, chart, labs and discussed the procedure including the risks, benefits and alternatives for the proposed anesthesia with the patient or authorized representative who has indicated his/her understanding and acceptance.       Plan Discussed with: CRNA and Anesthesiologist  Anesthesia Plan Comments: (Labs reviewed, platelets acceptable. Discussed risks and benefits of spinal, including spinal/epidural hematoma, infection, failed  block, and PDPH. Patient expressed understanding and wished to proceed. )         Anesthesia Quick Evaluation

## 2024-05-30 NOTE — Interval H&P Note (Signed)
 History and Physical Interval Note:  05/30/2024 10:30 AM  Joann Nguyen  has presented today for surgery, with the diagnosis of Degernative joint disease knee left.  The various methods of treatment have been discussed with the patient and family. After consideration of risks, benefits and other options for treatment, the patient has consented to  Procedure(s): ARTHROPLASTY, KNEE, TOTAL (Left) as a surgical intervention.  The patient's history has been reviewed, patient examined, no change in status, stable for surgery.  I have reviewed the patient's chart and labs.  Questions were answered to the patient's satisfaction.     Reyes JAYSON Billing

## 2024-05-31 ENCOUNTER — Encounter (HOSPITAL_COMMUNITY): Payer: Self-pay | Admitting: Specialist

## 2024-05-31 DIAGNOSIS — M1712 Unilateral primary osteoarthritis, left knee: Secondary | ICD-10-CM | POA: Diagnosis not present

## 2024-05-31 DIAGNOSIS — M1990 Unspecified osteoarthritis, unspecified site: Secondary | ICD-10-CM | POA: Diagnosis present

## 2024-05-31 LAB — CBC
HCT: 32.4 % — ABNORMAL LOW (ref 36.0–46.0)
Hemoglobin: 9.8 g/dL — ABNORMAL LOW (ref 12.0–15.0)
MCH: 29.5 pg (ref 26.0–34.0)
MCHC: 30.2 g/dL (ref 30.0–36.0)
MCV: 97.6 fL (ref 80.0–100.0)
Platelets: 309 K/uL (ref 150–400)
RBC: 3.32 MIL/uL — ABNORMAL LOW (ref 3.87–5.11)
RDW: 13 % (ref 11.5–15.5)
WBC: 10.6 K/uL — ABNORMAL HIGH (ref 4.0–10.5)
nRBC: 0 % (ref 0.0–0.2)

## 2024-05-31 LAB — BASIC METABOLIC PANEL WITH GFR
Anion gap: 13 (ref 5–15)
BUN: 17 mg/dL (ref 8–23)
CO2: 22 mmol/L (ref 22–32)
Calcium: 9.4 mg/dL (ref 8.9–10.3)
Chloride: 103 mmol/L (ref 98–111)
Creatinine, Ser: 1.34 mg/dL — ABNORMAL HIGH (ref 0.44–1.00)
GFR, Estimated: 40 mL/min — ABNORMAL LOW (ref >60)
Glucose, Bld: 168 mg/dL — ABNORMAL HIGH (ref 70–99)
Potassium: 4.5 mmol/L (ref 3.5–5.1)
Sodium: 139 mmol/L (ref 135–145)

## 2024-05-31 MED ORDER — HYDROMORPHONE HCL 2 MG PO TABS
2.0000 mg | ORAL_TABLET | ORAL | Status: DC | PRN
  Administered 2024-05-31 – 2024-06-02 (×7): 2 mg via ORAL
  Filled 2024-05-31 (×7): qty 1

## 2024-05-31 NOTE — Progress Notes (Signed)
 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 PT PMH includes but is not limited to: arthritis, HA, HTN, hypothyroidism, and R TKA.    PT Comments  Pt very cooperative and up to ambulate increased distance in hall.  Pt noted improved pain control with use of KI.    If plan is discharge home, recommend the following: A little help with walking and/or transfers;A little help with bathing/dressing/bathroom;Assistance with cooking/housework;Assist for transportation;Help with stairs or ramp for entrance   Can travel by private vehicle        Equipment Recommendations  Rolling walker (2 wheels)    Recommendations for Other Services       Precautions / Restrictions Precautions Precautions: Knee;Fall Required Braces or Orthoses: Knee Immobilizer - Left Knee Immobilizer - Left: Discontinue once straight leg raise with < 10 degree lag Restrictions Weight Bearing Restrictions Per Provider Order: Yes LLE Weight Bearing Per Provider Order: Weight bearing as tolerated     Mobility  Bed Mobility Overal bed mobility: Needs Assistance Bed Mobility: Sit to Supine     Supine to sit: Contact guard, HOB elevated, Used rails Sit to supine: Min assist   General bed mobility comments: cues for sequence with min assist for L LE    Transfers Overall transfer level: Needs assistance Equipment used: Rolling walker (2 wheels) Transfers: Sit to/from Stand Sit to Stand: Contact guard assist           General transfer comment: min cues for LE management and use of UEs to self assist    Ambulation/Gait Ambulation/Gait assistance: Contact guard assist Gait Distance (Feet): 74 Feet Assistive device: Rolling walker (2 wheels) Gait Pattern/deviations: Step-to pattern, Decreased step length - right, Decreased  step length - left, Shuffle, Trunk flexed Gait velocity: decreased     General Gait Details: cues for posture, sequence and position from [employer]             Wheelchair Mobility     Tilt Bed    Modified Rankin (Stroke Patients Only)       Balance Overall balance assessment: Needs assistance Sitting-balance support: Feet supported Sitting balance-Leahy Scale: Good     Standing balance support: Single extremity supported Standing balance-Leahy Scale: Poor                              Communication Communication Communication: Impaired Factors Affecting Communication: Hearing impaired  Cognition Arousal: Alert Behavior During Therapy: WFL for tasks assessed/performed   PT - Cognitive impairments: No apparent impairments                         Following commands: Intact      Cueing    Exercises Total Joint Exercises Ankle Circles/Pumps: AROM, Both, 15 reps, Supine Quad Sets: AROM, Both, 10 reps, Supine Heel Slides: AAROM, Left, 15 reps, Supine Straight Leg Raises: AAROM, Left, 10 reps, Supine    General Comments        Pertinent Vitals/Pain Pain Assessment Pain Assessment: 0-10 Pain Score: 4  Pain Location: L knee and LE Pain Descriptors / Indicators: Aching, Sore Pain Intervention(s): Limited activity within patient's tolerance, Monitored during session, Premedicated before session, Ice applied    Home Living  Prior Function            PT Goals (current goals can now be found in the care plan section) Acute Rehab PT Goals Patient Stated Goal: to be able to get back to walking and exercise PT Goal Formulation: With patient Time For Goal Achievement: 06/13/24 Potential to Achieve Goals: Good Progress towards PT goals: Progressing toward goals    Frequency    7X/week      PT Plan      Co-evaluation              AM-PAC PT 6 Clicks Mobility   Outcome Measure   Help needed turning from your back to your side while in a flat bed without using bedrails?: A Little Help needed moving from lying on your back to sitting on the side of a flat bed without using bedrails?: A Little Help needed moving to and from a bed to a chair (including a wheelchair)?: A Little Help needed standing up from a chair using your arms (e.g., wheelchair or bedside chair)?: A Little Help needed to walk in hospital room?: A Little Help needed climbing 3-5 steps with a railing? : A Lot 6 Click Score: 17    End of Session Equipment Utilized During Treatment: Gait belt;Left knee immobilizer Activity Tolerance: Patient limited by pain Patient left: in bed;with call bell/phone within reach;with bed alarm set Nurse Communication: Mobility status PT Visit Diagnosis: Unsteadiness on feet (R26.81);Other abnormalities of gait and mobility (R26.89);Muscle weakness (generalized) (M62.81);Difficulty in walking, not elsewhere classified (R26.2);Pain Pain - Right/Left: Left Pain - part of body: Knee;Leg     Time: 8891-8867 PT Time Calculation (min) (ACUTE ONLY): 24 min  Charges:    $Gait Training: 23-37 mins $Therapeutic Exercise: 8-22 mins PT General Charges $$ ACUTE PT VISIT: 1 Visit                     [facility] PT Acute Rehabilitation Services Office [phone]    Joann Nguyen 05/31/2024, 12:23 PM

## 2024-05-31 NOTE — Progress Notes (Signed)
 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 PT PMH includes but is not limited to: arthritis, HA, HTN, hypothyroidism, and R TKA.    PT Comments  Pt continues cooperative and up to ambulate increased distance in hall and negotiated single steps to access home and for stool to higher bed.  Pt continues to report pain a 7-8/10 despite premed.    If plan is discharge home, recommend the following: A little help with walking and/or transfers;A little help with bathing/dressing/bathroom;Assistance with cooking/housework;Assist for transportation;Help with stairs or ramp for entrance   Can travel by private vehicle        Equipment Recommendations  Rolling walker (2 wheels)    Recommendations for Other Services       Precautions / Restrictions Precautions Precautions: Knee;Fall Required Braces or Orthoses: Knee Immobilizer - Left Knee Immobilizer - Left: Discontinue once straight leg raise with < 10 degree lag Restrictions Weight Bearing Restrictions Per Provider Order: Yes LLE Weight Bearing Per Provider Order: Weight bearing as tolerated     Mobility  Bed Mobility Overal bed mobility: Needs Assistance Bed Mobility: Supine to Sit     Supine to sit: Min assist Sit to supine: Min assist   General bed mobility comments: cues for sequence with min assist for L LE    Transfers Overall transfer level: Needs assistance Equipment used: Rolling walker (2 wheels) Transfers: Sit to/from Stand Sit to Stand: Contact guard assist           General transfer comment: min cues for LE management and use of UEs to self assist    Ambulation/Gait Ambulation/Gait assistance: Contact guard assist Gait Distance (Feet): 89 Feet Assistive device: Rolling walker (2 wheels) Gait Pattern/deviations:  Step-to pattern, Decreased step length - right, Decreased step length - left, Shuffle, Trunk flexed Gait velocity: decreased     General Gait Details: cues for posture, sequence and position from RW   Stairs Stairs: Yes Stairs assistance: Min assist Stair Management: No rails, Step to pattern, Backwards, Forwards, With walker Number of Stairs: 4 General stair comments: single step x 4 - twice fwd and twice bkwd - cues for sequence   Wheelchair Mobility     Tilt Bed    Modified Rankin (Stroke Patients Only)       Balance Overall balance assessment: Needs assistance Sitting-balance support: Feet supported Sitting balance-Leahy Scale: Good     Standing balance support: Single extremity supported Standing balance-Leahy Scale: Poor                              Communication Communication Communication: Impaired Factors Affecting Communication: Hearing impaired  Cognition Arousal: Alert Behavior During Therapy: WFL for tasks assessed/performed   PT - Cognitive impairments: No apparent impairments                         Following commands: Intact      Cueing    Exercises Total Joint Exercises Ankle Circles/Pumps: AROM, Both, 15 reps, Supine Quad Sets: AROM, Both, 10 reps, Supine Heel Slides: AAROM, Left, 15 reps, Supine Straight Leg Raises: AAROM, Left, 10 reps, Supine    General Comments        Pertinent Vitals/Pain Pain Assessment Pain Assessment: 0-10 Pain Score: 8  Pain Location: L knee and LE Pain Descriptors / Indicators: Aching, Sore Pain Intervention(s): Limited activity within patient's tolerance, Monitored during session, Premedicated before session, Ice applied    Home Living                          Prior Function            PT Goals (current goals can now be found in the care plan section) Acute Rehab PT Goals Patient Stated Goal: to be able to get back to walking and exercise PT Goal Formulation:  With patient Time For Goal Achievement: 06/13/24 Potential to Achieve Goals: Good Progress towards PT goals: Progressing toward goals    Frequency    7X/week      PT Plan      Co-evaluation              AM-PAC PT 6 Clicks Mobility   Outcome Measure  Help needed turning from your back to your side while in a flat bed without using bedrails?: A Little Help needed moving from lying on your back to sitting on the side of a flat bed without using bedrails?: A Little Help needed moving to and from a bed to a chair (including a wheelchair)?: A Little Help needed standing up from a chair using your arms (e.g., wheelchair or bedside chair)?: A Little Help needed to walk in hospital room?: A Little Help needed climbing 3-5 steps with a railing? : A Little 6 Click Score: 18    End of Session Equipment Utilized During Treatment: Gait belt;Left knee immobilizer Activity Tolerance: Patient limited by pain Patient left: Other (comment);with call bell/phone within reach (bathroom) Nurse Communication: Mobility status PT Visit Diagnosis: Unsteadiness on feet (R26.81);Other abnormalities of gait and mobility (R26.89);Muscle weakness (generalized) (M62.81);Difficulty in walking, not elsewhere classified (R26.2);Pain Pain - Right/Left: Left Pain - part of body: Knee;Leg     Time: 8555-8488 PT Time Calculation (min) (ACUTE ONLY): 27 min  Charges:    $Gait Training: 8-22 mins $Therapeutic Activity: 8-22 mins PT General Charges $$ ACUTE PT VISIT: 1 Visit                     [facility] PT Acute Rehabilitation Services Office [phone]    Philmore Lepore 05/31/2024, 3:16 PM

## 2024-05-31 NOTE — Plan of Care (Signed)

## 2024-05-31 NOTE — TOC Transition Note (Signed)
 Transition of Care [facility]) - Discharge Note   Patient Details  Name: Joann Nguyen MRN: [id number] Date of Birth: Nov 02, 1945  Transition of Care [facility]) CM/SW Contact:  Alfonse JONELLE Rex, RN Phone Number: 05/31/2024, 9:32 AM   Clinical Narrative:  s/p L TKA on 9/10, HHPT[facility], [employer] delivered youth RW to bedside. No TOC needs.       Final next level of care: Home w Home Health Services Barriers to Discharge: No Barriers Identified   Patient Goals and CMS Choice Patient states their goals for this hospitalization and ongoing recovery are:: return home          Discharge Placement                       Discharge Plan and Services Additional resources added to the After Visit Summary for                                       Social Drivers of Health (SDOH) Interventions SDOH Screenings   Food Insecurity: No Food Insecurity (05/30/2024)  Housing: Low Risk  (05/30/2024)  Transportation Needs: No Transportation Needs (05/30/2024)  Utilities: Not At Risk (05/30/2024)  Financial Resource Strain: Low Risk  (04/13/2023)   Received from [facility]  Physical Activity: Unknown (04/13/2023)   Received from [facility]  Social Connections: Unknown (05/30/2024)  Stress: No Stress Concern Present (04/13/2023)   Received from [facility]  Tobacco Use: Low Risk  (05/30/2024)     Readmission Risk Interventions     No data to display

## 2024-05-31 NOTE — Progress Notes (Signed)
 Physical Therapy Treatment Patient Details Name: Joann Nguyen MRN: [id number] DOB: 1946/06/20 Today's Date: 05/31/2024   History of Present Illness 78 yo female presents to therapy s/p L TKA due to failure of conservative measures. PT PMH includes but is not limited to: arthritis, HA, HTN, hypothyroidism, and R TKA.    PT Comments  Pt cooperative but tolerated therex program only before requesting additional pain meds - had received Tramadol  and Robaxin  prior to PT.  Will follow later this am with gait training.    If plan is discharge home, recommend the following: A little help with walking and/or transfers;A little help with bathing/dressing/bathroom;Assistance with cooking/housework;Assist for transportation;Help with stairs or ramp for entrance   Can travel by private vehicle        Equipment Recommendations  Rolling walker (2 wheels)    Recommendations for Other Services       Precautions / Restrictions Precautions Precautions: Knee;Fall Restrictions Weight Bearing Restrictions Per Provider Order: Yes LLE Weight Bearing Per Provider Order: Weight bearing as tolerated     Mobility  Bed Mobility               General bed mobility comments: Pt up to chair with RN    Transfers                        Ambulation/Gait                   Stairs             Wheelchair Mobility     Tilt Bed    Modified Rankin (Stroke Patients Only)       Balance                                            Communication Communication Communication: Impaired Factors Affecting Communication: Hearing impaired  Cognition Arousal: Alert Behavior During Therapy: WFL for tasks assessed/performed   PT - Cognitive impairments: No apparent impairments                         Following commands: Intact      Cueing    Exercises Total Joint Exercises Ankle Circles/Pumps: AROM, Both, 15 reps, Supine Quad Sets: AROM, Both,  10 reps, Supine Heel Slides: AAROM, Left, 15 reps, Supine Straight Leg Raises: AAROM, Left, 10 reps, Supine    General Comments        Pertinent Vitals/Pain Pain Assessment Pain Assessment: 0-10 Pain Score: 9  Pain Location: L knee and LE Pain Descriptors / Indicators: Aching, Constant, Discomfort, Dull, Grimacing, Operative site guarding Pain Intervention(s): Limited activity within patient's tolerance, Monitored during session, Premedicated before session    Home Living                          Prior Function            PT Goals (current goals can now be found in the care plan section) Acute Rehab PT Goals Patient Stated Goal: to be able to get back to walking and exercise PT Goal Formulation: With patient Time For Goal Achievement: 06/13/24 Potential to Achieve Goals: Good Progress towards PT goals: Not progressing toward goals - comment (pain)    Frequency    7X/week  PT Plan      Co-evaluation              AM-PAC PT 6 Clicks Mobility   Outcome Measure  Help needed turning from your back to your side while in a flat bed without using bedrails?: A Little Help needed moving from lying on your back to sitting on the side of a flat bed without using bedrails?: A Little Help needed moving to and from a bed to a chair (including a wheelchair)?: A Little Help needed standing up from a chair using your arms (e.g., wheelchair or bedside chair)?: A Little Help needed to walk in hospital room?: A Little Help needed climbing 3-5 steps with a railing? : A Lot 6 Click Score: 17    End of Session Equipment Utilized During Treatment: Gait belt Activity Tolerance: Patient limited by pain Patient left: in chair;with call bell/phone within reach;with chair alarm set Nurse Communication: Mobility status;Patient requests pain meds PT Visit Diagnosis: Unsteadiness on feet (R26.81);Other abnormalities of gait and mobility (R26.89);Muscle weakness  (generalized) (M62.81);Difficulty in walking, not elsewhere classified (R26.2);Pain Pain - Right/Left: Left Pain - part of body: Knee;Leg     Time: 9086-9067 PT Time Calculation (min) (ACUTE ONLY): 19 min  Charges:    $Therapeutic Exercise: 8-22 mins PT General Charges $$ ACUTE PT VISIT: 1 Visit                     [facility] PT Acute Rehabilitation Services Office [phone]    Eilleen Davoli 05/31/2024, 10:18 AM

## 2024-05-31 NOTE — Progress Notes (Signed)
 Subjective: 1 Day Post-Op Procedure(s) (LRB): ARTHROPLASTY, KNEE, TOTAL (Left) Patient reports pain as moderate.    Objective: Vital signs in last 24 hours: Temp:  [97.8 F (36.6 C)-98.7 F (37.1 C)] 98 F (36.7 C) (09/11 0754) Pulse Rate:  [77-97] 87 (09/11 0754) Resp:  [6-22] 16 (09/11 0754) BP: (115-182)/(59-90) 148/79 (09/11 0754) SpO2:  [93 %-100 %] 98 % (09/11 0618) Weight:  [86.1 kg] 86.1 kg (09/11 0754)  Intake/Output from previous day: 09/10 0701 - 09/11 0700 In: 2233.1 [P.O.:120; I.V.:1613.1; IV Piggyback:500] Out: 1050 [Urine:950; Blood:100] Intake/Output this shift: Total I/O In: -  Out: 30 [Urine:30]  Recent Labs    05/31/24 0346  HGB 9.8*   Recent Labs    05/31/24 0346  WBC 10.6*  RBC 3.32*  HCT 32.4*  PLT 309   Recent Labs    05/31/24 0346  NA 139  K 4.5  CL 103  CO2 22  BUN 17  CREATININE 1.34*  GLUCOSE 168*  CALCIUM  9.4   No results for input(s): LABPT, INR in the last 72 hours.  Neurologically intact ABD soft Neurovascular intact Sensation intact distally Intact pulses distally Dorsiflexion/Plantar flexion intact Incision: dressing C/D/I and no drainage No cellulitis present Compartment soft No sign of DVT   Assessment/Plan: 1 Day Post-Op Procedure(s) (LRB): ARTHROPLASTY, KNEE, TOTAL (Left) Advance diet Up with therapy D/C IV fluids   Anticipated LOS equal to or greater than 2 midnights due to - Age 79 and older with one or more of the following:  - Obesity  - Expected need for hospital services (PT, OT, Nursing) required for safe  discharge  - Anticipated need for postoperative skilled nursing care or inpatient rehab  - Active co-morbidities: None OR   - Unanticipated findings during/Post Surgery: Slow post-op progression: GI, pain control, mobility  - Patient is a high risk of re-admission due to: None   Darice CHRISTELLA Randy 05/31/2024, 8:17 AM

## 2024-05-31 NOTE — Op Note (Unsigned)
 NAME: Joann Nguyen, Joann Nguyen MEDICAL RECORD NO: [id number] ACCOUNT NO: [id number] DATE OF BIRTH: 17-Jun-1946 FACILITY: [facility] LOCATION: [id number] PHYSICIAN: Reyes KYM Billing, MD  Operative Report   DATE OF PROCEDURE: 05/30/2024  PREOPERATIVE DIAGNOSIS:  End-stage osteoarthrosis, valgus deformity, left knee.  POSTOPERATIVE DIAGNOSIS:  End-stage osteoarthrosis, valgus deformity, left knee.  PROCEDURES PERFORMED:  Left total knee arthroplasty utilizing a DePuy Attune rotating platform, size 5 femur, size 4 tibia, 5 mm insert, and 35 patella.  ANESTHESIA:  Spinal.  ASSISTANT:  Jaclyn Bissell, PA.  HISTORY:  The patient is a 78 year old with end-stage osteoarthrosis, valgus deformity, left knee, and patellofemoral arthrosis, indicated for replacement of the degenerated joint.  Risks and benefits were discussed including bleeding, infection, damage  to neurovascular structures, no change in symptoms or worsening of symptoms, DVT, PE, anesthetic complications, etc.  DESCRIPTION OF PROCEDURE:  The patient was placed in the supine position and after induction of adequate spinal anesthesia, 2 g of Kefzol .  The left lower extremity was prepped and draped and exsanguinated in the usual sterile fashion.  A thigh  tourniquet was inflated to 225 mmHg.  A midline incision was made over the knee.  Full-thickness flaps were developed.  Ample subcutaneous adipose tissue was encountered.  A median parapatellar arthrotomy was performed.  The patella was gently everted.   The knee was flexed.  Tricompartmental osteoarthrosis was noted.  Leksell were used to remove osteophytes.  Remnants of medial and lateral menisci were excised.  Elevated soft tissues medially, preserving the MCL.  A notch was placed above the femoral  notch as a starting hole from the femoral drill, which was drilled in line with the femur.  It was irrigated, confirmed with a T-handle, and an intramedullary guide, pinned off the distal femur due to a flexion  contracture, it was pinned.  I performed a  cut.  Incised off the anterior cortex to be a 5 and 3 degrees of external rotation.  This was pinned.  I performed anterior, posterior, and chamfer cuts, protecting soft tissues at all times.  Subluxed the tibia low side laterally.  Three-off that defect  was utilized.  An external alignment guide was placed parallel to the shaft, with a 3-degree slope, bisecting the tibiotalar joint and was pinned.  I performed the tibial cut.  It was slightly tight.  I removed an additional 2 mm.  Checked at a 5, it  was satisfactory in extension.  I flexed the knee, subluxed the tibia, sized it to a 4 for maximal coverage just in the medial aspect of the medial third of the tibial tubercle, pinned, harvested bone centrally, impacted it in the distal femur, drilled  centrally, used a punch guide, then placed a notch guide over the femur, bisecting the canal.  This was pinned.  I performed a box cut and then placed a trial femur, which fit flush, drilled the lug holes, placed a 5 mm insert, reduced it, and had full  extension, full flexion, and good stability to varus and valgus stressing at 0 and 30 degrees.  Negative anterior drawer.  We everted patella, was fairly thin laterally, with a previous fracture, and was noncongruent.  I started with the patellar jig.   It measured initially at 18.  I planed, began a plane at 14 mm of thickness and then completed it freehand.  The residual was at 14 mm.  It was sized to a 35 with a paddle parallel to the joint surface.  I drilled the peg  holes, placed the patella,  reduced it, and had excellent patellofemoral tracking.  Next, I performed a lateral release though.  All instrumentation was removed.  I removed osteophytes with a rongeur, copiously irrigation with pulsatile lavage, and thoroughly cleaned all surfaces.  I flexed the knee, mixed cement on the back table under  vacuum centrifuge, and drilled holes in the proximal tibia.   Cement was placed in the proximal tibia, digitally pressurizing it.  I cemented the tibial tray.  Cement was placed on the tray and the bone, and it was impacted into place.  Residual cement  was removed.  Cement was placed on the femoral component and in the femur, impacted into place, fit flush, placed a 5 mm insert, reduced it, and held to axial load in extension throughout the curing of the cement.  I cemented and clamped the patella.   Marcaine  was placed in the wound followed by Prontosan.  The wound was covered during the curing of the cement.  The tourniquet was deflated at 60 minutes.  Any bleeding was cauterized with Aquamantys.  Following this, I removed the insert, meticulously  removed all redundant cement, and copiously irrigated with pulsatile lavage and Prontosan.  I selected a 5 insert, reduced it, and had full extension, full flexion, and good stability to varus and valgus stress at 0 and 30 degrees.  Negative anterior  drawer.  Then, in mid-flexion, I reapproximated the patellar arthrotomy with 1 Vicryl interrupted figure-of-eight sutures followed by a running StrataFix.  Had excellent patellofemoral tracking following this.  And excellent stability.  I copiously  irrigated the subcutaneous tissues with irrigation and Prontosan.  Multiple 2-0 layers were used and staples were used for the skin.  A sterile dressing was applied.  The patient was placed in an immobilizer and transported to the recovery room in  satisfactory condition.  The patient tolerated the procedure well.  There were no complications.    Assistant Jaclyn Bissell, [place] was used throughout the case for patient positioning, traction, and closure.  BLOOD LOSS:  50 mL.    MUK D: 05/30/2024 1:37:28 pm T: 05/31/2024 12:01:00 am  JOB: [id number]/ [id number]

## 2024-06-01 DIAGNOSIS — M1712 Unilateral primary osteoarthritis, left knee: Secondary | ICD-10-CM | POA: Diagnosis not present

## 2024-06-01 LAB — BASIC METABOLIC PANEL WITH GFR
Anion gap: 10 (ref 5–15)
BUN: 18 mg/dL (ref 8–23)
CO2: 25 mmol/L (ref 22–32)
Calcium: 9.2 mg/dL (ref 8.9–10.3)
Chloride: 99 mmol/L (ref 98–111)
Creatinine, Ser: 1.28 mg/dL — ABNORMAL HIGH (ref 0.44–1.00)
GFR, Estimated: 42 mL/min — ABNORMAL LOW (ref >60)
Glucose, Bld: 130 mg/dL — ABNORMAL HIGH (ref 70–99)
Potassium: 4.4 mmol/L (ref 3.5–5.1)
Sodium: 135 mmol/L (ref 135–145)

## 2024-06-01 LAB — CBC
HCT: 29 % — ABNORMAL LOW (ref 36.0–46.0)
Hemoglobin: 8.9 g/dL — ABNORMAL LOW (ref 12.0–15.0)
MCH: 29.9 pg (ref 26.0–34.0)
MCHC: 30.7 g/dL (ref 30.0–36.0)
MCV: 97.3 fL (ref 80.0–100.0)
Platelets: 250 K/uL (ref 150–400)
RBC: 2.98 MIL/uL — ABNORMAL LOW (ref 3.87–5.11)
RDW: 13.3 % (ref 11.5–15.5)
WBC: 12.5 K/uL — ABNORMAL HIGH (ref 4.0–10.5)
nRBC: 0 % (ref 0.0–0.2)

## 2024-06-01 NOTE — Plan of Care (Signed)

## 2024-06-01 NOTE — Plan of Care (Signed)
  Problem: Pain Management: Goal: Pain level will decrease with appropriate interventions Outcome: Progressing   Problem: Activity: Goal: Range of joint motion will improve Outcome: Progressing   Problem: Education: Goal: Knowledge of the prescribed therapeutic regimen will improve Outcome: Progressing   Problem: Safety: Goal: Ability to remain free from injury will improve Outcome: Progressing   Problem: Elimination: Goal: Will not experience complications related to urinary retention Outcome: Progressing

## 2024-06-01 NOTE — Care Management Obs Status (Signed)
 MEDICARE OBSERVATION STATUS NOTIFICATION   Patient Details  Name: Joann Nguyen MRN: [id number] Date of Birth: [birth year]   Medicare Observation Status Notification Given:  Yes    Sonda Manuella Quill, RN 06/01/2024, 9:58 AM

## 2024-06-01 NOTE — Progress Notes (Signed)
 Physical Therapy Treatment Patient Details Name: Joann Nguyen MRN: [id number] DOB: 09-07-1946 Today's Date: 06/01/2024   History of Present Illness 78 yo female presents to therapy s/p L TKA due to failure of conservative measures. PT PMH includes but is not limited to: arthritis, HA, HTN, hypothyroidism, and R TKA.    PT Comments  Pt continues cooperative and progressing with mobility but requiring increased time for all tasks and reports pain as 8/10.  Pt up to ambulate increased distance in hall, up to bathroom for toileting with hand hygiene standing at sink, and performed therex program with assist.  Pt hopeful for dc home tomorrow.    If plan is discharge home, recommend the following: A little help with walking and/or transfers;A little help with bathing/dressing/bathroom;Assistance with cooking/housework;Assist for transportation;Help with stairs or ramp for entrance   Can travel by private vehicle        Equipment Recommendations  Rolling walker (2 wheels);BSC/3in1 (pt now requesting 3n1 - states her dr was going to order it - RN aware)    Recommendations for Other Services       Precautions / Restrictions Precautions Precautions: Knee;Fall Required Braces or Orthoses: Knee Immobilizer - Left Knee Immobilizer - Left: Discontinue once straight leg raise with < 10 degree lag Restrictions Weight Bearing Restrictions Per Provider Order: Yes LLE Weight Bearing Per Provider Order: Weight bearing as tolerated     Mobility  Bed Mobility Overal bed mobility: Needs Assistance Bed Mobility: Sit to Supine     Supine to sit: Min assist Sit to supine: Min assist   General bed mobility comments: cues for sequence with min assist for L LE    Transfers Overall transfer level: Needs assistance Equipment used: Rolling walker (2 wheels) Transfers: Sit to/from Stand Sit to Stand: Contact guard assist, Supervision           General transfer comment: min cues for LE management  and use of UEs to self assist    Ambulation/Gait Ambulation/Gait assistance: Contact guard assist, Supervision Gait Distance (Feet): 94 Feet (and 20' back from bathroom) Assistive device: Rolling walker (2 wheels) Gait Pattern/deviations: Step-to pattern, Decreased step length - right, Decreased step length - left, Shuffle, Trunk flexed Gait velocity: decreased     General Gait Details: min cues for posture, sequence and position from [employer]             Wheelchair Mobility     Tilt Bed    Modified Rankin (Stroke Patients Only)       Balance Overall balance assessment: Needs assistance Sitting-balance support: Feet supported Sitting balance-Leahy Scale: Good     Standing balance support: No upper extremity supported Standing balance-Leahy Scale: Fair                              Hotel manager: Impaired Factors Affecting Communication: Hearing impaired  Cognition Arousal: Alert Behavior During Therapy: WFL for tasks assessed/performed   PT - Cognitive impairments: No apparent impairments                         Following commands: Intact      Cueing Cueing Techniques: Verbal cues  Exercises Total Joint Exercises Ankle Circles/Pumps: AROM, Both, 15 reps, Supine Quad Sets: AROM, Both, 10 reps, Supine Heel Slides: AAROM, Left, 15 reps, Supine Straight Leg Raises: AAROM, Left, Supine, 15 reps Goniometric ROM: AAROM L knee -5 -  45    General Comments        Pertinent Vitals/Pain Pain Assessment Pain Assessment: 0-10 Pain Score: 8  Pain Location: L knee and LE Pain Descriptors / Indicators: Aching, Sore Pain Intervention(s): Limited activity within patient's tolerance, Monitored during session, Premedicated before session, Ice applied    Home Living                          Prior Function            PT Goals (current goals can now be found in the care plan section) Acute Rehab  PT Goals Patient Stated Goal: to be able to get back to walking and exercise PT Goal Formulation: With patient Time For Goal Achievement: 06/13/24 Potential to Achieve Goals: Good Progress towards PT goals: Progressing toward goals    Frequency    7X/week      PT Plan      Co-evaluation              AM-PAC PT 6 Clicks Mobility   Outcome Measure  Help needed turning from your back to your side while in a flat bed without using bedrails?: A Little Help needed moving from lying on your back to sitting on the side of a flat bed without using bedrails?: A Little Help needed moving to and from a bed to a chair (including a wheelchair)?: A Little Help needed standing up from a chair using your arms (e.g., wheelchair or bedside chair)?: A Little Help needed to walk in hospital room?: A Little Help needed climbing 3-5 steps with a railing? : A Little 6 Click Score: 18    End of Session Equipment Utilized During Treatment: Gait belt;Left knee immobilizer Activity Tolerance: Patient limited by pain Patient left: in bed;with call bell/phone within reach;with bed alarm set Nurse Communication: Mobility status PT Visit Diagnosis: Unsteadiness on feet (R26.81);Other abnormalities of gait and mobility (R26.89);Muscle weakness (generalized) (M62.81);Difficulty in walking, not elsewhere classified (R26.2);Pain Pain - Right/Left: Left Pain - part of body: Knee;Leg     Time: 1420-1501 PT Time Calculation (min) (ACUTE ONLY): 41 min  Charges:    $Gait Training: 8-22 mins $Therapeutic Exercise: 8-22 mins $Therapeutic Activity: 8-22 mins PT General Charges $$ ACUTE PT VISIT: 1 Visit                     [facility] PT Acute Rehabilitation Services Office [phone]    Aniyla Harling 06/01/2024, 3:55 PM

## 2024-06-01 NOTE — Progress Notes (Signed)
 Subjective: 2 Days Post-Op Procedure(s) (LRB): ARTHROPLASTY, KNEE, TOTAL (Left) Patient reports pain as moderate and severe.    Objective: Vital signs in last 24 hours: Temp:  [98 F (36.7 C)-99.6 F (37.6 C)] 98.2 F (36.8 C) (09/12 0603) Pulse Rate:  [86-106] 99 (09/12 0603) Resp:  [15-18] 18 (09/12 0603) BP: (148-162)/(67-76) 162/67 (09/12 0603) SpO2:  [97 %-98 %] 97 % (09/12 0603)  Intake/Output from previous day: 09/11 0701 - 09/12 0700 In: 1173.9 [P.O.:840; I.V.:333.9] Out: 330 [Urine:330] Intake/Output this shift: No intake/output data recorded.  Recent Labs    05/31/24 0346 06/01/24 0326  HGB 9.8* 8.9*   Recent Labs    05/31/24 0346 06/01/24 0326  WBC 10.6* 12.5*  RBC 3.32* 2.98*  HCT 32.4* 29.0*  PLT 309 250   Recent Labs    05/31/24 0346 06/01/24 0326  NA 139 135  K 4.5 4.4  CL 103 99  CO2 22 25  BUN 17 18  CREATININE 1.34* 1.28*  GLUCOSE 168* 130*  CALCIUM  9.4 9.2   No results for input(s): LABPT, INR in the last 72 hours.  Neurologically intact ABD soft Neurovascular intact Sensation intact distally Intact pulses distally Dorsiflexion/Plantar flexion intact Incision: dressing C/D/I and no drainage No cellulitis present Compartment soft No sign of DVT   Assessment/Plan: 2 Days Post-Op Procedure(s) (LRB): ARTHROPLASTY, KNEE, TOTAL (Left) Advance diet Up with therapy D/C IV fluids Possible d/c later today if pain well controlled and passes PT   Caroleann Casler M Quinlan Vollmer 06/01/2024, 9:00 AM

## 2024-06-01 NOTE — Progress Notes (Signed)
 Physical Therapy Treatment Patient Details Name: Joann Nguyen MRN: [id number] DOB: [birth year] Today's Date: 06/01/2024   History of Present Illness 78 yo female presents to therapy s/p L TKA due to failure of conservative measures. PT PMH includes but is not limited to: arthritis, HA, HTN, hypothyroidism, and R TKA.    PT Comments  Pt continues cooperative but reporting continued difficulty with pain control - currently 8/10 despite premed.  Pt requesting to use bathroom, KI reviewed and applied and pt assisted up to ambulate limited distance to bathroom and assisted to sit on commode. Pt requesting time I'm hoping something is going to happen.  Will follow up with therex and gait training as tolerated.   If plan is discharge home, recommend the following: A little help with walking and/or transfers;A little help with bathing/dressing/bathroom;Assistance with cooking/housework;Assist for transportation;Help with stairs or ramp for entrance   Can travel by private vehicle        Equipment Recommendations  Rolling walker (2 wheels)    Recommendations for Other Services       Precautions / Restrictions Precautions Precautions: Knee;Fall Required Braces or Orthoses: Knee Immobilizer - Left Knee Immobilizer - Left: Discontinue once straight leg raise with < 10 degree lag Restrictions Weight Bearing Restrictions Per Provider Order: Yes LLE Weight Bearing Per Provider Order: Weight bearing as tolerated     Mobility  Bed Mobility Overal bed mobility: Needs Assistance Bed Mobility: Supine to Sit     Supine to sit: Min assist     General bed mobility comments: cues for sequence with min assist for L LE    Transfers Overall transfer level: Needs assistance Equipment used: Rolling walker (2 wheels) Transfers: Sit to/from Stand Sit to Stand: Contact guard assist           General transfer comment: min cues for LE management and use of UEs to self assist     Ambulation/Gait Ambulation/Gait assistance: Contact guard assist Gait Distance (Feet): 20 Feet Assistive device: Rolling walker (2 wheels) Gait Pattern/deviations: Step-to pattern, Decreased step length - right, Decreased step length - left, Shuffle, Trunk flexed Gait velocity: decreased     General Gait Details: cues for posture, sequence and position from [employer]             Wheelchair Mobility     Tilt Bed    Modified Rankin (Stroke Patients Only)       Balance Overall balance assessment: Needs assistance Sitting-balance support: Feet supported Sitting balance-Leahy Scale: Good     Standing balance support: No upper extremity supported Standing balance-Leahy Scale: Fair                              Hotel manager: Impaired Factors Affecting Communication: Hearing impaired  Cognition Arousal: Alert Behavior During Therapy: WFL for tasks assessed/performed   PT - Cognitive impairments: No apparent impairments                         Following commands: Intact      Cueing    Exercises      General Comments        Pertinent Vitals/Pain Pain Assessment Pain Assessment: 0-10 Pain Score: 8  Pain Location: L knee and LE Pain Descriptors / Indicators: Aching, Sore Pain Intervention(s): Limited activity within patient's tolerance, Monitored during session, Premedicated before session    Home Living  Prior Function            PT Goals (current goals can now be found in the care plan section) Acute Rehab PT Goals Patient Stated Goal: to be able to get back to walking and exercise PT Goal Formulation: With patient Time For Goal Achievement: 06/13/24 Potential to Achieve Goals: Good Progress towards PT goals: Progressing toward goals    Frequency    7X/week      PT Plan      Co-evaluation              AM-PAC PT 6 Clicks Mobility    Outcome Measure  Help needed turning from your back to your side while in a flat bed without using bedrails?: A Little Help needed moving from lying on your back to sitting on the side of a flat bed without using bedrails?: A Little Help needed moving to and from a bed to a chair (including a wheelchair)?: A Little Help needed standing up from a chair using your arms (e.g., wheelchair or bedside chair)?: A Little Help needed to walk in hospital room?: A Little Help needed climbing 3-5 steps with a railing? : A Little 6 Click Score: 18    End of Session Equipment Utilized During Treatment: Gait belt;Left knee immobilizer Activity Tolerance: Patient limited by pain Patient left: Other (comment);with call bell/phone within reach (bathroom) Nurse Communication: Mobility status PT Visit Diagnosis: Unsteadiness on feet (R26.81);Other abnormalities of gait and mobility (R26.89);Muscle weakness (generalized) (M62.81);Difficulty in walking, not elsewhere classified (R26.2);Pain Pain - Right/Left: Left Pain - part of body: Knee;Leg     Time: 8978-8966 PT Time Calculation (min) (ACUTE ONLY): 12 min  Charges:    $Gait Training: 8-22 mins PT General Charges $$ ACUTE PT VISIT: 1 Visit                     [facility] PT Acute Rehabilitation Services Office [phone]    Rufina Kimery 06/01/2024, 10:51 AM

## 2024-06-02 DIAGNOSIS — M1712 Unilateral primary osteoarthritis, left knee: Secondary | ICD-10-CM | POA: Diagnosis not present

## 2024-06-02 MED ORDER — TIZANIDINE HCL 2 MG PO TABS: 2.0000 mg | ORAL_TABLET | Freq: Four times a day (QID) | ORAL | 0 refills | Status: AC | PRN

## 2024-06-02 MED ORDER — POLYETHYLENE GLYCOL 3350 17 G PO PACK: 17.0000 g | PACK | Freq: Every day | ORAL | 0 refills | Status: AC

## 2024-06-02 MED ORDER — HYDROMORPHONE HCL 2 MG PO TABS: 2.0000 mg | ORAL_TABLET | ORAL | 0 refills | Status: AC | PRN

## 2024-06-02 MED ORDER — ASPIRIN 81 MG PO CHEW: 81.0000 mg | CHEWABLE_TABLET | Freq: Two times a day (BID) | ORAL | 1 refills | Status: AC

## 2024-06-02 MED ORDER — DOCUSATE SODIUM 100 MG PO CAPS: 100.0000 mg | ORAL_CAPSULE | Freq: Two times a day (BID) | ORAL | 0 refills | Status: AC | PRN

## 2024-06-02 NOTE — Discharge Summary (Signed)
 Physician Discharge Summary   Patient ID: Joann Nguyen MRN: [id number] DOB/AGE: [birth year] 78 y.o.  Admit date: 05/30/2024 Discharge date: 06/02/24  Primary Diagnosis: left knee primary osteoarthritis  Admission Diagnoses:  Past Medical History:  Diagnosis Date   Arthritis    Headache    Hypertension    Hypothyroidism    Thyroid  disease    Discharge Diagnoses:   Principal Problem:   DJD (degenerative joint disease) of knee Active Problems:   DJD (degenerative joint disease)  Estimated body mass index is 38.34 kg/m as calculated from the following:   Height as of this encounter: 4' 11 (1.499 m).   Weight as of this encounter: 86.1 kg.  Procedure:  Procedure(s) (LRB): ARTHROPLASTY, KNEE, TOTAL (Left)   Consults: None  HPI: see h&p Laboratory Data: Admission on 05/30/2024  Component Date Value Ref Range Status   WBC 05/31/2024 10.6 (H)  4.0 - 10.5 K/uL Final   RBC 05/31/2024 3.32 (L)  3.87 - 5.11 MIL/uL Final   Hemoglobin 05/31/2024 9.8 (L)  12.0 - 15.0 g/dL Final   HCT 90/88/7974 32.4 (L)  36.0 - 46.0 % Final   MCV 05/31/2024 97.6  80.0 - 100.0 fL Final   MCH 05/31/2024 29.5  26.0 - 34.0 pg Final   MCHC 05/31/2024 30.2  30.0 - 36.0 g/dL Final   RDW 90/88/7974 13.0  11.5 - 15.5 % Final   Platelets 05/31/2024 309  150 - 400 K/uL Final   nRBC 05/31/2024 0.0  0.0 - 0.2 % Final   Performed at [facility], [address] [place], [place] [address]   Sodium 05/31/2024 139  135 - 145 mmol/L Final   Potassium 05/31/2024 4.5  3.5 - 5.1 mmol/L Final   Chloride 05/31/2024 103  98 - 111 mmol/L Final   CO2 05/31/2024 22  22 - 32 mmol/L Final   Glucose, Bld 05/31/2024 168 (H)  70 - 99 mg/dL Final   Glucose reference range applies only to samples taken after fasting for at least 8 hours.   BUN 05/31/2024 17  8 - 23 mg/dL Final   Creatinine, Ser 05/31/2024 1.34 (H)  0.44 - 1.00 mg/dL Final   Calcium  05/31/2024 9.4  8.9 - 10.3 mg/dL Final   GFR, Estimated  05/31/2024 40 (L)  >60 mL/min Final   Comment: (NOTE) Calculated using the CKD-EPI Creatinine Equation (2021)    Anion gap 05/31/2024 13  5 - 15 Final   Performed at [facility], [address] [place], [place] [address]   WBC 06/01/2024 12.5 (H)  4.0 - 10.5 K/uL Final   RBC 06/01/2024 2.98 (L)  3.87 - 5.11 MIL/uL Final   Hemoglobin 06/01/2024 8.9 (L)  12.0 - 15.0 g/dL Final   HCT 90/87/7974 29.0 (L)  36.0 - 46.0 % Final   MCV 06/01/2024 97.3  80.0 - 100.0 fL Final   MCH 06/01/2024 29.9  26.0 - 34.0 pg Final   MCHC 06/01/2024 30.7  30.0 - 36.0 g/dL Final   RDW 90/87/7974 13.3  11.5 - 15.5 % Final   Platelets 06/01/2024 250  150 - 400 K/uL Final   nRBC 06/01/2024 0.0  0.0 - 0.2 % Final   Performed at [facility], [address] [place], [place] [address]   Sodium 06/01/2024 135  135 - 145 mmol/L Final   Potassium 06/01/2024 4.4  3.5 - 5.1 mmol/L Final   Chloride 06/01/2024 99  98 - 111 mmol/L Final   CO2  06/01/2024 25  22 - 32 mmol/L Final   Glucose, Bld 06/01/2024 130 (H)  70 - 99 mg/dL Final   Glucose reference range applies only to samples taken after fasting for at least 8 hours.   BUN 06/01/2024 18  8 - 23 mg/dL Final   Creatinine, Ser 06/01/2024 1.28 (H)  0.44 - 1.00 mg/dL Final   Calcium  06/01/2024 9.2  8.9 - 10.3 mg/dL Final   GFR, Estimated 06/01/2024 42 (L)  >60 mL/min Final   Comment: (NOTE) Calculated using the CKD-EPI Creatinine Equation (2021)    Anion gap 06/01/2024 10  5 - 15 Final   Performed at [facility], [address], [place], [place] [address]  Hospital Outpatient Visit on 05/18/2024  Component Date Value Ref Range Status   Sodium 05/18/2024 139  135 - 145 mmol/L Final   Potassium 05/18/2024 4.2  3.5 - 5.1 mmol/L Final   Chloride 05/18/2024 102  98 - 111 mmol/L Final   CO2 05/18/2024 25  22 - 32 mmol/L Final   Glucose, Bld 05/18/2024 125 (H)  70 - 99 mg/dL Final   Glucose reference range  applies only to samples taken after fasting for at least 8 hours.   BUN 05/18/2024 16  8 - 23 mg/dL Final   Creatinine, Ser 05/18/2024 1.35 (H)  0.44 - 1.00 mg/dL Final   Calcium  05/18/2024 9.8  8.9 - 10.3 mg/dL Final   GFR, Estimated 05/18/2024 40 (L)  >60 mL/min Final   Comment: (NOTE) Calculated using the CKD-EPI Creatinine Equation (2021)    Anion gap 05/18/2024 12  5 - 15 Final   Performed at [facility], [address] [place], [place] [address]   WBC 05/18/2024 8.2  4.0 - 10.5 K/uL Final   RBC 05/18/2024 3.80 (L)  3.87 - 5.11 MIL/uL Final   Hemoglobin 05/18/2024 11.2 (L)  12.0 - 15.0 g/dL Final   HCT 91/70/7974 37.3  36.0 - 46.0 % Final   MCV 05/18/2024 98.2  80.0 - 100.0 fL Final   MCH 05/18/2024 29.5  26.0 - 34.0 pg Final   MCHC 05/18/2024 30.0  30.0 - 36.0 g/dL Final   RDW 91/70/7974 13.0  11.5 - 15.5 % Final   Platelets 05/18/2024 341  150 - 400 K/uL Final   nRBC 05/18/2024 0.0  0.0 - 0.2 % Final   Performed at [facility], [address] [place], [place] [address]   MRSA, PCR 05/18/2024 NEGATIVE  NEGATIVE Final   Staphylococcus aureus 05/18/2024 NEGATIVE  NEGATIVE Final   Comment: (NOTE) The Xpert SA Assay (FDA approved for NASAL specimens in patients 40 years of age and older), is one component of a comprehensive surveillance program. It is not intended to diagnose infection nor to guide or monitor treatment. Performed at [facility], [address], [place], [place] [address]      X-Rays:DG Knee 1-2 Views Left Result Date: 05/30/2024 CLINICAL DATA:  Status post left total knee replacement. EXAM: LEFT KNEE - 1-2 VIEW COMPARISON:  None Available. FINDINGS: The femoral and tibial components are well situated. Expected postoperative changes are noted in the soft tissues anteriorly. IMPRESSION: Status post left total knee arthroplasty. Electronically Signed   By: Lynwood Landy Raddle M.D.   On: 05/30/2024 14:05     EKG: Orders placed or performed during the hospital encounter of 05/18/24   EKG 12 lead per protocol   EKG 12 lead per protocol  Hospital Course: Joann Nguyen is a 78 y.o. who was admitted to [facility]. They were brought to the operating room on 05/30/2024 and underwent Procedure(s): ARTHROPLASTY, KNEE, TOTAL.  Patient tolerated the procedure well and was later transferred to the recovery room and then to the orthopaedic floor for postoperative care.  They were given PO and IV analgesics for pain control following their surgery.  They were given 24 hours of postoperative antibiotics of  Anti-infectives (From admission, onward)    Start     Dose/Rate Route Frequency Ordered Stop   05/30/24 1715  ceFAZolin  (ANCEF ) IVPB 2g/100 mL premix        2 g 200 mL/hr over 30 Minutes Intravenous Every 6 hours 05/30/24 1524 05/30/24 2329   05/30/24 0945  ceFAZolin  (ANCEF ) IVPB 2g/100 mL premix        2 g 200 mL/hr over 30 Minutes Intravenous On call to O.R. 05/30/24 9056 05/30/24 1115      and started on DVT prophylaxis in the form of Aspirin , TED hose, and SCDs.   PT and OT were ordered for total joint protocol.  Discharge planning consulted to help with postop disposition and equipment needs.  Patient had a difficult night on the evening of surgery.  They started to get up OOB with therapy on day one. Continued to work with therapy into day two.    By day three, the patient had progressed with therapy and meeting their goals.  Incision was healing well.  Patient was seen in rounds and was ready to go home.   Diet: Regular diet Activity:WBAT Follow-up:in 10-14 days Disposition - Home with HHPT Discharged Condition: good   Discharge Instructions     Call MD / Call 911   Complete by: As directed    If you experience chest pain or shortness of breath, CALL 911 and be transported to the hospital emergency room.  If you develope a fever above 101 F, pus (white drainage) or increased  drainage or redness at the wound, or calf pain, call your surgeon's office.   Constipation Prevention   Complete by: As directed    Drink plenty of fluids.  Prune juice may be helpful.  You may use a stool softener, such as Colace (over the counter) 100 mg twice a day.  Use MiraLax  (over the counter) for constipation as needed.   Diet - low sodium heart healthy   Complete by: As directed    Increase activity slowly as tolerated   Complete by: As directed    Post-operative opioid taper instructions:   Complete by: As directed    POST-OPERATIVE OPIOID TAPER INSTRUCTIONS: It is important to wean off of your opioid medication as soon as possible. If you do not need pain medication after your surgery it is ok to stop day one. Opioids include: Codeine, Hydrocodone (Norco, Vicodin), Oxycodone (Percocet, oxycontin ) and hydromorphone  amongst others.  Long term and even short term use of opiods can cause: Increased pain response Dependence Constipation Depression Respiratory depression And more.  Withdrawal symptoms can include Flu like symptoms Nausea, vomiting And more Techniques to manage these symptoms Hydrate well Eat regular healthy meals Stay active Use relaxation techniques(deep breathing, meditating, yoga) Do Not substitute Alcohol to help with tapering If you have been on opioids for less than two weeks and do not have pain than it is ok to stop all together.  Plan to wean off of opioids This plan should start within one week post op of your joint  replacement. Maintain the same interval or time between taking each dose and first decrease the dose.  Cut the total daily intake of opioids by one tablet each day Next start to increase the time between doses. The last dose that should be eliminated is the evening dose.         Allergies as of 06/02/2024   No Known Allergies      Medication List     STOP taking these medications    HYDROcodone -acetaminophen  5-325 MG  tablet Commonly known as: Norco   oxyCODONE  5 MG immediate release tablet Commonly known as: Roxicodone        TAKE these medications    acetaminophen  650 MG CR tablet Commonly known as: TYLENOL  Take 650 mg by mouth every 8 (eight) hours as needed for pain.   aspirin  81 MG chewable tablet Chew 1 tablet (81 mg total) by mouth 2 (two) times daily.   cetirizine 10 MG tablet Commonly known as: ZYRTEC Take 10 mg by mouth at bedtime.   diclofenac  Sodium 1 % Gel Commonly known as: VOLTAREN  Apply 4 g topically 4 (four) times daily.   docusate sodium  100 MG capsule Commonly known as: COLACE Take 1 capsule (100 mg total) by mouth 2 (two) times daily as needed for mild constipation.   gabapentin  100 MG capsule Commonly known as: NEURONTIN  Take 100 mg by mouth at bedtime as needed (pain).   HYDROmorphone  2 MG tablet Commonly known as: DILAUDID  Take 1-2 tablets (2-4 mg total) by mouth every 3 (three) hours as needed for severe pain (pain score 7-10).   levothyroxine  50 MCG tablet Commonly known as: SYNTHROID  Take 50 mcg by mouth daily before breakfast.   losartan -hydrochlorothiazide  100-25 MG tablet Commonly known as: HYZAAR Take 1 tablet by mouth daily.   montelukast  10 MG tablet Commonly known as: SINGULAIR  Take 10 mg by mouth at bedtime.   omeprazole  20 MG capsule Commonly known as: PRILOSEC Take 20 mg by mouth daily before breakfast.   omeprazole  20 MG capsule Commonly known as: PRILOSEC Take 1 capsule (20 mg total) by mouth 2 (two) times daily before a meal.   polyethylene glycol 17 g packet Commonly known as: MIRALAX  / GLYCOLAX  Take 17 g by mouth daily.   rosuvastatin  20 MG tablet Commonly known as: CRESTOR  Take 20 mg by mouth at bedtime.   tiZANidine  2 MG tablet Commonly known as: ZANAFLEX  Take 1 tablet (2 mg total) by mouth every 6 (six) hours as needed for muscle spasms.   traMADol  50 MG tablet Commonly known as: ULTRAM  Take 50 mg by mouth every 6  (six) hours as needed for moderate pain (pain score 4-6).        Follow-up Information     Duwayne Purchase, MD Follow up in 2 week(s).   Specialty: Orthopedic Surgery Contact information: [address] [place] [address] [phone]                 Signed: Alda Gaultney PA-C Orthopaedic Surgery 06/02/2024, 7:57 AM

## 2024-06-02 NOTE — Progress Notes (Signed)
 Subjective: 3 Days Post-Op Procedure(s) (LRB): ARTHROPLASTY, KNEE, TOTAL (Left) Patient reports pain as moderate.  Dilaudid  does seem to be doing better for pain. Feels she may be able to go home today.  Objective: Vital signs in last 24 hours: Temp:  [98.4 F (36.9 C)-99 F (37.2 C)] 98.6 F (37 C) (09/13 0448) Pulse Rate:  [95-102] 95 (09/12 1343) Resp:  [15-17] 16 (09/13 0448) BP: (143-156)/(66-76) 149/68 (09/13 0448) SpO2:  [97 %] 97 % (09/13 0448)  Intake/Output from previous day: 09/12 0701 - 09/13 0700 In: 1080 [P.O.:1080] Out: -  Intake/Output this shift: No intake/output data recorded.  Recent Labs    05/31/24 0346 06/01/24 0326  HGB 9.8* 8.9*   Recent Labs    05/31/24 0346 06/01/24 0326  WBC 10.6* 12.5*  RBC 3.32* 2.98*  HCT 32.4* 29.0*  PLT 309 250   Recent Labs    05/31/24 0346 06/01/24 0326  NA 139 135  K 4.5 4.4  CL 103 99  CO2 22 25  BUN 17 18  CREATININE 1.34* 1.28*  GLUCOSE 168* 130*  CALCIUM  9.4 9.2   No results for input(s): LABPT, INR in the last 72 hours.  Neurologically intact ABD soft Neurovascular intact Sensation intact distally Intact pulses distally Dorsiflexion/Plantar flexion intact Incision: dressing C/D/I and no drainage No cellulitis present Compartment soft No sign of DVT   Assessment/Plan: 3 Days Post-Op Procedure(s) (LRB): ARTHROPLASTY, KNEE, TOTAL (Left) Advance diet Up with therapy D/C IV fluids Discussed D/C instructions  Will place d/c order, if pt not ready for d/c later today please text or call me [phone]   Joann Nguyen Randy 06/02/2024, 7:53 AM

## 2024-06-02 NOTE — Progress Notes (Signed)
 Physical Therapy Treatment Patient Details Name: Joann Nguyen MRN: [id number] DOB: 11/13/1945 Today's Date: 06/02/2024   History of Present Illness 78 yo female presents to therapy s/p L TKA due to failure of conservative measures. PT PMH includes but is not limited to: arthritis, HA, HTN, hypothyroidism, and R TKA.    PT Comments  Pt performed therex program with assist and requiring encouragement to put forth increased effort.  Pt requests short break prior to attempting gait.     If plan is discharge home, recommend the following: A little help with walking and/or transfers;A little help with bathing/dressing/bathroom;Assistance with cooking/housework;Assist for transportation;Help with stairs or ramp for entrance   Can travel by private vehicle        Equipment Recommendations  Rolling walker (2 wheels);BSC/3in1    Recommendations for Other Services       Precautions / Restrictions Precautions Precautions: Knee;Fall Required Braces or Orthoses: Knee Immobilizer - Left Knee Immobilizer - Left: Discontinue once straight leg raise with < 10 degree lag Restrictions Weight Bearing Restrictions Per Provider Order: Yes LLE Weight Bearing Per Provider Order: Weight bearing as tolerated     Mobility  Bed Mobility               General bed mobility comments: Pt up in recliner    Transfers                        Ambulation/Gait                   Stairs             Wheelchair Mobility     Tilt Bed    Modified Rankin (Stroke Patients Only)       Balance                                            Communication Communication Communication: Impaired Factors Affecting Communication: Hearing impaired  Cognition Arousal: Alert Behavior During Therapy: WFL for tasks assessed/performed   PT - Cognitive impairments: No apparent impairments                         Following commands: Intact      Cueing  Cueing Techniques: Verbal cues  Exercises Total Joint Exercises Ankle Circles/Pumps: AROM, Both, 15 reps, Supine Quad Sets: AROM, Both, 10 reps, Supine Heel Slides: AAROM, Left, Supine, 20 reps Straight Leg Raises: AAROM, Left, Supine, 20 reps Long Arc Quad: AAROM, Left, 10 reps, Seated Goniometric ROM: AAROM L knee -5 - 50    General Comments        Pertinent Vitals/Pain Pain Assessment Pain Assessment: 0-10 Pain Score: 7  Pain Location: L knee and LE with knee flexion Pain Descriptors / Indicators: Aching, Sore Pain Intervention(s): Limited activity within patient's tolerance, Monitored during session, Premedicated before session    Home Living                          Prior Function            PT Goals (current goals can now be found in the care plan section) Acute Rehab PT Goals Patient Stated Goal: to be able to get back to walking and exercise PT Goal Formulation: With patient Time For Goal Achievement: 06/13/24  Potential to Achieve Goals: Good Progress towards PT goals: Progressing toward goals    Frequency    7X/week      PT Plan      Co-evaluation              AM-PAC PT 6 Clicks Mobility   Outcome Measure  Help needed turning from your back to your side while in a flat bed without using bedrails?: A Little Help needed moving from lying on your back to sitting on the side of a flat bed without using bedrails?: A Little Help needed moving to and from a bed to a chair (including a wheelchair)?: A Little Help needed standing up from a chair using your arms (e.g., wheelchair or bedside chair)?: A Little Help needed to walk in hospital room?: A Little Help needed climbing 3-5 steps with a railing? : A Little 6 Click Score: 18    End of Session Equipment Utilized During Treatment: Gait belt Activity Tolerance: Patient limited by pain;Patient limited by fatigue Patient left: in chair;with call bell/phone within reach;with chair alarm  set Nurse Communication: Mobility status PT Visit Diagnosis: Unsteadiness on feet (R26.81);Other abnormalities of gait and mobility (R26.89);Muscle weakness (generalized) (M62.81);Difficulty in walking, not elsewhere classified (R26.2);Pain Pain - Right/Left: Left Pain - part of body: Knee;Leg     Time: 8944-8886 PT Time Calculation (min) (ACUTE ONLY): 18 min  Charges:    $Therapeutic Exercise: 8-22 mins PT General Charges $$ ACUTE PT VISIT: 1 Visit                     [facility] PT Acute Rehabilitation Services Office [phone]    Joann Nguyen 06/02/2024, 12:22 PM

## 2024-06-02 NOTE — Progress Notes (Signed)
 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 PT PMH includes but is not limited to: arthritis, HA, HTN, hypothyroidism, and R TKA.    PT Comments  Pt cooperative and reports decreased pain from prior session.  Pt requiring increased time for tasks but self cues for transfers and min cues for RW management only during gait.  Pt ambulated increased distance in hall, negotiated stairs, and reviewed written HEP.  Pt reports HHPT has reached out to her during her hospital stay.    If plan is discharge home, recommend the following: A little help with walking and/or transfers;A little help with bathing/dressing/bathroom;Assistance with cooking/housework;Assist for transportation;Help with stairs or ramp for entrance   Can travel by private vehicle        Equipment Recommendations  Rolling walker (2 wheels);BSC/3in1    Recommendations for Other Services       Precautions / Restrictions Precautions Precautions: Knee;Fall Required Braces or Orthoses: Knee Immobilizer - Left Knee Immobilizer - Left: Discontinue once straight leg raise with < 10 degree lag Restrictions Weight Bearing Restrictions Per Provider Order: Yes LLE Weight Bearing Per Provider Order: Weight bearing as tolerated     Mobility  Bed Mobility               General bed mobility comments: Pt up in recliner and requests back to same    Transfers Overall transfer level: Needs assistance Equipment used: Rolling walker (2 wheels) Transfers: Sit to/from Stand Sit to Stand: Supervision           General transfer comment: pt self cues, no physical assist    Ambulation/Gait Ambulation/Gait assistance: Contact guard assist, Supervision Gait Distance (Feet): 100 Feet Assistive device: Rolling walker (2 wheels) Gait  Pattern/deviations: Step-to pattern, Decreased step length - right, Decreased step length - left, Shuffle, Trunk flexed Gait velocity: decreased     General Gait Details: min cues for posture, sequence and position from RW   Stairs Stairs: Yes Stairs assistance: Min assist Stair Management: No rails, Step to pattern, Backwards, Forwards, With walker Number of Stairs: 2 General stair comments: single step x 2 - once fwd and once bkwd - cues for sequence   Wheelchair Mobility     Tilt Bed    Modified Rankin (Stroke Patients Only)       Balance Overall balance assessment: Needs assistance Sitting-balance support: Feet supported Sitting balance-Leahy Scale: Good     Standing balance support: No upper extremity supported Standing balance-Leahy Scale: Fair                              Hotel manager: Impaired Factors Affecting Communication: Hearing impaired  Cognition Arousal: Alert Behavior During Therapy: WFL for tasks assessed/performed   PT - Cognitive impairments: No apparent impairments                         Following commands: Intact      Cueing Cueing Techniques: Verbal cues  Exercises Total Joint Exercises Ankle Circles/Pumps: AROM, Both, 15 reps, Supine Quad Sets: AROM, Both, 10 reps, Supine Heel Slides: AAROM, Left, Supine, 20 reps Straight Leg Raises: AAROM, Left, Supine, 20 reps Long Arc Quad: AAROM, Left, 10 reps, Seated Goniometric ROM: AAROM L knee -5 - 50  General Comments        Pertinent Vitals/Pain Pain Assessment Pain Assessment: 0-10 Pain Score: 5  Pain Location: L knee with activity Pain Descriptors / Indicators: Aching, Sore Pain Intervention(s): Limited activity within patient's tolerance, Monitored during session, Premedicated before session, Ice applied    Home Living                          Prior Function            PT Goals (current goals can now be  found in the care plan section) Acute Rehab PT Goals Patient Stated Goal: to be able to get back to walking and exercise PT Goal Formulation: With patient Time For Goal Achievement: 06/13/24 Potential to Achieve Goals: Good Progress towards PT goals: Progressing toward goals    Frequency    7X/week      PT Plan      Co-evaluation              AM-PAC PT 6 Clicks Mobility   Outcome Measure  Help needed turning from your back to your side while in a flat bed without using bedrails?: A Little Help needed moving from lying on your back to sitting on the side of a flat bed without using bedrails?: A Little Help needed moving to and from a bed to a chair (including a wheelchair)?: A Little Help needed standing up from a chair using your arms (e.g., wheelchair or bedside chair)?: A Little Help needed to walk in hospital room?: A Little Help needed climbing 3-5 steps with a railing? : A Little 6 Click Score: 18    End of Session Equipment Utilized During Treatment: Gait belt Activity Tolerance: Patient tolerated treatment well Patient left: in chair;with call bell/phone within reach;with chair alarm set Nurse Communication: Mobility status PT Visit Diagnosis: Unsteadiness on feet (R26.81);Other abnormalities of gait and mobility (R26.89);Muscle weakness (generalized) (M62.81);Difficulty in walking, not elsewhere classified (R26.2);Pain Pain - Right/Left: Left Pain - part of body: Knee;Leg     Time: 8876-8851 PT Time Calculation (min) (ACUTE ONLY): 25 min  Charges:    $Gait Training: 8-22 mins $Therapeutic Exercise: 8-22 mins $Therapeutic Activity: 8-22 mins PT General Charges $$ ACUTE PT VISIT: 1 Visit                     [facility] PT Acute Rehabilitation Services Office [phone]    Koen Antilla 06/02/2024, 12:28 PM

## 2024-06-02 NOTE — Plan of Care (Signed)
  Problem: Pain Management: Goal: Pain level will decrease with appropriate interventions Outcome: Progressing   Problem: Activity: Goal: Range of joint motion will improve Outcome: Progressing   Problem: Education: Goal: Knowledge of the prescribed therapeutic regimen will improve Outcome: Progressing   Problem: Safety: Goal: Ability to remain free from injury will improve Outcome: Progressing
